# Patient Record
Sex: Female | Born: 1975 | Race: Black or African American | Hispanic: No | Marital: Married | Smoking: Never smoker
Health system: Southern US, Community
[De-identification: ages and names within clinical notes are randomized; demographics above are authoritative.]

## PROBLEM LIST (undated history)

## (undated) DIAGNOSIS — L659 Nonscarring hair loss, unspecified: Secondary | ICD-10-CM

## (undated) DIAGNOSIS — N390 Urinary tract infection, site not specified: Secondary | ICD-10-CM

## (undated) DIAGNOSIS — E119 Type 2 diabetes mellitus without complications: Secondary | ICD-10-CM

## (undated) HISTORY — PX: OTHER SURGICAL HISTORY: SHX169

## (undated) HISTORY — DX: Type 2 diabetes mellitus without complications: E11.9

## (undated) HISTORY — DX: Nonscarring hair loss, unspecified: L65.9

## (undated) HISTORY — DX: Urinary tract infection, site not specified: N39.0

---

## 2016-06-29 DIAGNOSIS — E1065 Type 1 diabetes mellitus with hyperglycemia: Secondary | ICD-10-CM | POA: Diagnosis not present

## 2016-07-15 DIAGNOSIS — L658 Other specified nonscarring hair loss: Secondary | ICD-10-CM | POA: Diagnosis not present

## 2016-07-15 DIAGNOSIS — L669 Cicatricial alopecia, unspecified: Secondary | ICD-10-CM | POA: Diagnosis not present

## 2016-07-29 DIAGNOSIS — E1165 Type 2 diabetes mellitus with hyperglycemia: Secondary | ICD-10-CM | POA: Diagnosis not present

## 2016-08-19 ENCOUNTER — Ambulatory Visit: Payer: Self-pay

## 2016-08-26 ENCOUNTER — Ambulatory Visit: Payer: Self-pay

## 2016-09-09 ENCOUNTER — Other Ambulatory Visit (HOSPITAL_COMMUNITY)
Admission: RE | Admit: 2016-09-09 | Discharge: 2016-09-09 | Disposition: A | Discharge status: Home / Self Care | Payer: 59 | Source: Ambulatory Visit | Attending: Family Medicine | Admitting: Family Medicine

## 2016-09-09 ENCOUNTER — Other Ambulatory Visit: Payer: Self-pay

## 2016-09-09 DIAGNOSIS — Z23 Encounter for immunization: Secondary | ICD-10-CM | POA: Diagnosis not present

## 2016-09-09 DIAGNOSIS — L219 Seborrheic dermatitis, unspecified: Secondary | ICD-10-CM | POA: Diagnosis not present

## 2016-09-09 DIAGNOSIS — Z01419 Encounter for gynecological examination (general) (routine) without abnormal findings: Secondary | ICD-10-CM | POA: Diagnosis not present

## 2016-09-09 DIAGNOSIS — Z0000 Encounter for general adult medical examination without abnormal findings: Secondary | ICD-10-CM | POA: Diagnosis not present

## 2016-09-11 LAB — CYTOLOGY - PAP

## 2016-09-15 DIAGNOSIS — E1065 Type 1 diabetes mellitus with hyperglycemia: Secondary | ICD-10-CM | POA: Diagnosis not present

## 2016-09-21 ENCOUNTER — Other Ambulatory Visit: Payer: Self-pay | Admitting: Family

## 2016-09-21 DIAGNOSIS — Z1231 Encounter for screening mammogram for malignant neoplasm of breast: Secondary | ICD-10-CM

## 2016-09-25 DIAGNOSIS — E1065 Type 1 diabetes mellitus with hyperglycemia: Secondary | ICD-10-CM | POA: Diagnosis not present

## 2016-10-08 ENCOUNTER — Ambulatory Visit: Payer: Self-pay

## 2016-10-29 DIAGNOSIS — E1165 Type 2 diabetes mellitus with hyperglycemia: Secondary | ICD-10-CM | POA: Diagnosis not present

## 2016-11-05 DIAGNOSIS — E1165 Type 2 diabetes mellitus with hyperglycemia: Secondary | ICD-10-CM | POA: Diagnosis not present

## 2016-11-06 DIAGNOSIS — Z0100 Encounter for examination of eyes and vision without abnormal findings: Secondary | ICD-10-CM | POA: Diagnosis not present

## 2016-11-06 DIAGNOSIS — E119 Type 2 diabetes mellitus without complications: Secondary | ICD-10-CM | POA: Diagnosis not present

## 2016-11-10 DIAGNOSIS — E1165 Type 2 diabetes mellitus with hyperglycemia: Secondary | ICD-10-CM | POA: Diagnosis not present

## 2016-12-03 ENCOUNTER — Ambulatory Visit: Payer: 59

## 2016-12-09 ENCOUNTER — Telehealth: Payer: 59 | Admitting: Family

## 2016-12-09 DIAGNOSIS — M545 Low back pain: Secondary | ICD-10-CM

## 2016-12-09 MED ORDER — BACLOFEN 10 MG PO TABS: 10.0000 mg | ORAL_TABLET | Freq: Three times a day (TID) | ORAL | 0 refills | Status: DC

## 2016-12-09 MED ORDER — NAPROXEN 500 MG PO TABS: 500.0000 mg | ORAL_TABLET | Freq: Two times a day (BID) | ORAL | 0 refills | Status: DC

## 2016-12-09 NOTE — Progress Notes (Signed)

## 2016-12-22 DIAGNOSIS — E1065 Type 1 diabetes mellitus with hyperglycemia: Secondary | ICD-10-CM | POA: Diagnosis not present

## 2017-01-01 ENCOUNTER — Ambulatory Visit: Payer: 59

## 2017-01-11 ENCOUNTER — Ambulatory Visit
Admission: RE | Admit: 2017-01-11 | Discharge: 2017-01-11 | Disposition: A | Discharge status: Home / Self Care | Payer: 59 | Source: Ambulatory Visit | Attending: Family | Admitting: Family

## 2017-01-11 DIAGNOSIS — Z1231 Encounter for screening mammogram for malignant neoplasm of breast: Secondary | ICD-10-CM

## 2017-01-11 DIAGNOSIS — N939 Abnormal uterine and vaginal bleeding, unspecified: Secondary | ICD-10-CM | POA: Diagnosis not present

## 2017-01-25 DIAGNOSIS — N930 Postcoital and contact bleeding: Secondary | ICD-10-CM | POA: Diagnosis not present

## 2017-01-25 DIAGNOSIS — N921 Excessive and frequent menstruation with irregular cycle: Secondary | ICD-10-CM | POA: Diagnosis not present

## 2017-01-25 DIAGNOSIS — N939 Abnormal uterine and vaginal bleeding, unspecified: Secondary | ICD-10-CM | POA: Diagnosis not present

## 2017-03-01 ENCOUNTER — Telehealth: Payer: 59 | Admitting: Family

## 2017-03-01 DIAGNOSIS — B373 Candidiasis of vulva and vagina: Secondary | ICD-10-CM

## 2017-03-01 DIAGNOSIS — B3731 Acute candidiasis of vulva and vagina: Secondary | ICD-10-CM

## 2017-03-01 MED ORDER — FLUCONAZOLE 150 MG PO TABS: 150.0000 mg | ORAL_TABLET | ORAL | 1 refills | Status: DC | PRN

## 2017-03-01 NOTE — Progress Notes (Signed)
We are sorry that you are not feeling well. Here is how we plan to help! Based on what you shared with me it looks like you: May have a yeast vaginosis  Vaginosis is an inflammation of the vagina that can result in discharge, itching and pain. The cause is usually a change in the normal balance of vaginal bacteria or an infection. Vaginosis can also result from reduced estrogen levels after menopause.  The most common causes of vaginosis are:   Bacterial vaginosis which results from an overgrowth of one on several organisms that are normally present in your vagina.   Yeast infections which are caused by a naturally occurring fungus called candida.   Vaginal atrophy (atrophic vaginosis) which results from the thinning of the vagina from reduced estrogen levels after menopause.   Trichomoniasis which is caused by a parasite and is commonly transmitted by sexual intercourse.  Factors that increase your risk of developing vaginosis include: Marland Kitchen Medications, such as antibiotics and steroids . Uncontrolled diabetes . Use of hygiene products such as bubble bath, vaginal spray or vaginal deodorant . Douching . Wearing damp or tight-fitting clothing . Using an intrauterine device (IUD) for birth control . Hormonal changes, such as those associated with pregnancy, birth control pills or menopause . Sexual activity . Having a sexually transmitted infection  Your treatment plan is A single Diflucan (fluconazole) 138m tablet once.  I have electronically sent this prescription into the pharmacy that you have chosen.  Be sure to take all of the medication as directed. Stop taking any medication if you develop a rash, tongue swelling or shortness of breath. Mothers who are breast feeding should consider pumping and discarding their breast milk while on these antibiotics. However, there is no consensus that infant exposure at these doses would be harmful.  Remember that medication creams can weaken latex  condoms. .Marland Kitchen  HOME CARE:  Good hygiene may prevent some types of vaginosis from recurring and may relieve some symptoms:  . Avoid baths, hot tubs and whirlpool spas. Rinse soap from your outer genital area after a shower, and dry the area well to prevent irritation. Don't use scented or harsh soaps, such as those with deodorant or antibacterial action. .Marland KitchenAvoid irritants. These include scented tampons and pads. . Wipe from front to back after using the toilet. Doing so avoids spreading fecal bacteria to your vagina.  Other things that may help prevent vaginosis include:  .Marland KitchenDon't douche. Your vagina doesn't require cleansing other than normal bathing. Repetitive douching disrupts the normal organisms that reside in the vagina and can actually increase your risk of vaginal infection. Douching won't clear up a vaginal infection. . Use a latex condom. Both female and female latex condoms may help you avoid infections spread by sexual contact. . Wear cotton underwear. Also wear pantyhose with a cotton crotch. If you feel comfortable without it, skip wearing underwear to bed. Yeast thrives in mCampbell SoupYour symptoms should improve in the next day or two.  GET HELP RIGHT AWAY IF:  . You have pain in your lower abdomen ( pelvic area or over your ovaries) . You develop nausea or vomiting . You develop a fever . Your discharge changes or worsens . You have persistent pain with intercourse . You develop shortness of breath, a rapid pulse, or you faint.  These symptoms could be signs of problems or infections that need to be evaluated by a medical provider now.  MAKE SURE YOU  Understand these instructions.  Will watch your condition.  Will get help right away if you are not doing well or get worse.  Your e-visit answers were reviewed by a board certified advanced clinical practitioner to complete your personal care plan. Depending upon the condition, your plan could have included  both over the counter or prescription medications. Please review your pharmacy choice to make sure that you have choses a pharmacy that is open for you to pick up any needed prescription, Your safety is important to [place]. If you have drug allergies check your prescription carefully.   You can use MyChart to ask questions about today's visit, request a non-urgent call back, or ask for a work or school excuse for 24 hours related to this e-Visit. If it has been greater than 24 hours you will need to follow up with your provider, or enter a new e-Visit to address those concerns. You will get a MyChart message within the next two days asking about your experience. I hope that your e-visit has been valuable and will speed your recovery.

## 2017-03-08 DIAGNOSIS — N979 Female infertility, unspecified: Secondary | ICD-10-CM | POA: Diagnosis not present

## 2017-03-10 DIAGNOSIS — E049 Nontoxic goiter, unspecified: Secondary | ICD-10-CM | POA: Diagnosis not present

## 2017-03-10 DIAGNOSIS — E1165 Type 2 diabetes mellitus with hyperglycemia: Secondary | ICD-10-CM | POA: Diagnosis not present

## 2017-03-20 DIAGNOSIS — E1065 Type 1 diabetes mellitus with hyperglycemia: Secondary | ICD-10-CM | POA: Diagnosis not present

## 2017-03-24 ENCOUNTER — Telehealth: Payer: 59 | Admitting: Family

## 2017-03-24 DIAGNOSIS — A084 Viral intestinal infection, unspecified: Secondary | ICD-10-CM | POA: Diagnosis not present

## 2017-03-24 MED ORDER — ONDANSETRON 4 MG PO TBDP: 4.0000 mg | ORAL_TABLET | Freq: Three times a day (TID) | ORAL | 0 refills | Status: DC | PRN

## 2017-03-24 NOTE — Progress Notes (Signed)
We are sorry that you are not feeling well. Here is how we plan to help!  Based on what you have shared with me it looks like you have a Virus that is irritating your GI tract.  Vomiting is the forceful emptying of a portion of the stomach's content through the mouth.  Although nausea and vomiting can make you feel miserable, it's important to remember that these are not diseases, but rather symptoms of an underlying illness.  When we treat short term symptoms, we always caution that any symptoms that persist should be fully evaluated in a medical office.  I have prescribed a medication that will help alleviate your symptoms and allow you to stay hydrated:  Zofran 4 mg 1 tablet every 8 hours as needed for nausea and vomiting  HOME CARE:  Drink clear liquids.  This is very important! Dehydration (the lack of fluid) can lead to a serious complication.  Start off with 1 tablespoon every 5 minutes for 8 hours.  You may begin eating bland foods after 8 hours without vomiting.  Start with saltine crackers, white bread, rice, mashed potatoes, applesauce.  After 48 hours on a bland diet, you may resume a normal diet.  Try to go to sleep.  Sleep often empties the stomach and relieves the need to vomit.  GET HELP RIGHT AWAY IF:   Your symptoms do not improve or worsen within 2 days after treatment.  You have a fever for over 3 days.  You cannot keep down fluids after trying the medication.  MAKE SURE YOU:   Understand these instructions.  Will watch your condition.  Will get help right away if you are not doing well or get worse.   Thank you for choosing an e-visit. Your e-visit answers were reviewed by a board certified advanced clinical practitioner to complete your personal care plan. Depending upon the condition, your plan could have included both over the counter or prescription medications. Please review your pharmacy choice. Be sure that the pharmacy you have chosen is open so  that you can pick up your prescription now.  If there is a problem you may message your provider in [place] to have the prescription routed to another pharmacy. Your safety is important to [place]. If you have drug allergies check your prescription carefully.  For the next 24 hours, you can use MyChart to ask questions about today's visit, request a non-urgent call back, or ask for a work or school excuse from your e-visit provider. You will get an e-mail in the next two days asking about your experience. I hope that your e-visit has been valuable and will speed your recovery.

## 2017-04-19 DIAGNOSIS — Z3141 Encounter for fertility testing: Secondary | ICD-10-CM | POA: Diagnosis not present

## 2017-04-23 ENCOUNTER — Other Ambulatory Visit: Payer: Self-pay | Admitting: *Deleted

## 2017-04-23 ENCOUNTER — Encounter: Payer: Self-pay | Admitting: *Deleted

## 2017-04-23 VITALS — BP 110/70 | HR 80 | Ht 71.0 in | Wt 196.6 lb

## 2017-04-23 DIAGNOSIS — E1037X9 Type 1 diabetes mellitus with diabetic macular edema, resolved following treatment, unspecified eye: Secondary | ICD-10-CM

## 2017-04-23 LAB — POCT CBG (FASTING - GLUCOSE)-MANUAL ENTRY: Glucose Fasting, POC: 109 mg/dL — AB (ref 70–99)

## 2017-04-23 LAB — POCT GLYCOSYLATED HEMOGLOBIN (HGB A1C): Hemoglobin A1C: 8.1

## 2017-04-23 NOTE — Patient Outreach (Addendum)
[facility]) Care Management   04/23/2017  Penny Andrade November 28, 1976 [id number]  Penny Andrade is an 41 y.o. female who presents to the [facility] office to enroll in the Link To Wellness program for self management assistance with Type I DM.  Subjective: Penny Andrade says she has been with [facility] since June of 2017 and works as a Designer, jewellery for [employer]. She says she was diagnosed with Type I DM in 1997 and was hospitalized at that time. She says she was also hospitalized in October of 2016 for diabetic ketoacidosis while she was [employer] and under much stress. She says she was referred to the Link To Wellness program through new employee orientation and her goal is to achieve improved glucose management so that she meet her A1C target of <7.0% and improve her chances of getting pregnant.  She say her last Hgb A1C was 8.7% in January 2018 despite the use of an Animas insulin pump. She says she wore the [place] in 2014 but had severe skin irritation with blistering to the adhesive so she stopped wearing it. Her current Animas pump was placed in May of 2017 while she was still living in [place].  She says she has an appointment with Dr. Buddy Andrade, (transferring from Dr. Jeanann Andrade),  to establish endocrinology care on 05/28/17.   Objective:   Review of Systems  Constitutional: Negative.     Physical Exam  Constitutional: She is oriented to person, place, and time. She appears well-developed and well-nourished.  Cardiovascular: Normal rate and regular rhythm.   Respiratory: Effort normal.  Musculoskeletal: Normal range of motion.  Neurological: She is alert and oriented to person, place, and time.  Skin: Skin is warm and dry.  Psychiatric: She has a normal mood and affect. Her behavior is normal. Judgment and thought content normal.   BP= 110/60 POC random CBG= 109 POC Hgb A1C= 8.1%  Encounter Medications:    Outpatient Encounter Prescriptions as of 04/23/2017  Medication Sig Note  . insulin lispro (HUMALOG) 100 UNIT/ML injection Inject into the skin 3 (three) times daily before meals. 04/23/2017: Uses in pump   No facility-administered encounter medications on file as of 04/23/2017.     Functional Status:   In your present state of health, do you have any difficulty performing the following activities: 04/23/2017  Hearing? N  Vision? N  Difficulty concentrating or making decisions? N  Walking or climbing stairs? N  Dressing or bathing? N  Doing errands, shopping? N  Preparing Food and eating ? N  Using the Toilet? N  In the past six months, have you accidently leaked urine? N  Do you have problems with loss of bowel control? N  Managing your Medications? N  Managing your Finances? N  Housekeeping or managing your Housekeeping? N  Some recent data might be hidden    Fall/Depression Screening:    PHQ 2/9 Scores 04/23/2017  PHQ - 2 Score 0    Assessment:  [employer] employee with Type I DM enrolling in the Link To Wellness program for self management assistance.   Plan:  [facility] CM Care Plan Problem One     Most Recent Value  Care Plan Problem One  [place] employee with Type DM enrolling in the Link To wellness program not meeting Hgb A1C target as evidenced by today's POC Hgb A1C= 8.1%  Role Documenting the Problem One  Care Management [place] for Problem  One  Active  THN Long Term Goal (31-90 days)  Improved DM management as evidenced by Hgb A1C<7.5% without increased incidences of hypoglycemia  THN Long Term Goal Start Date  04/23/17  Interventions for Problem One Long Term Goal  Discussed Link to Wellness program goals, requirements and benefits, reviewed member's rights and responsibilities ,provided diabetes information packet with explanation of contents, ensured member agreed and signed consent to participate and authorization to release and receive health  information, consent, participation agreement and consent to enroll in program, assessed member's current knowledge of diabetes, reviewed patient's CBG readings, reviewed blood glucose monitoring and interpretation, discussed recommended target ranges for pre-meal and post-meal, provided blood sugar log sheets with targets for pre and post meal, explained how to get her testing supplies at no cost from a [facility] pharmacy, reviewed pump benefit under the program's pharmacy benefit, discussed Medtronic 670 G closed loop insulin delivery system and encouraged her to discuss with her endocrinologist, discussed exercise opportunities offered for free by [facility],  discussed phone applications to use to help with CHO counting when food labels are not available,assessed and discussed results of today's POC Hgb A1C, Hgb A1C goal, and correlation to estimated average glucose, provided information on: prevention, detection, and treatment of long-term complications, discussed recommendations for day to day and long-term diabetes self-care, reviewed recommended daily foot checks, and yearly cholesterol, urine, and eye testing, and recommendations for medical, dental, and emotional self-care, will arrange for Link To Wellness follow up in one month     Will fax today's note to Penny Levels NP and Dr. Buddy Andrade.  RNCM will meet quarterly and as needed with patient per Link To Wellness program guidelines to assist with Type I DM self-management and assess patient's progress toward mutually set goals.

## 2017-04-26 ENCOUNTER — Encounter: Payer: Self-pay | Admitting: *Deleted

## 2017-06-02 DIAGNOSIS — M67911 Unspecified disorder of synovium and tendon, right shoulder: Secondary | ICD-10-CM | POA: Diagnosis not present

## 2017-06-07 ENCOUNTER — Telehealth: Payer: 59 | Admitting: Family

## 2017-06-07 DIAGNOSIS — B001 Herpesviral vesicular dermatitis: Secondary | ICD-10-CM

## 2017-06-07 MED ORDER — VALACYCLOVIR HCL 1 G PO TABS: 2000.0000 mg | ORAL_TABLET | Freq: Two times a day (BID) | ORAL | 0 refills | Status: DC

## 2017-06-07 NOTE — Progress Notes (Signed)
We are sorry that you are not feeling well.  Here is how we plan to help!  Based on what you have shared with me it does look like you have a viral infection.    Most cold sores or fever blisters are small fluid filled blisters around the mouth caused by herpes simplex virus.  The most common strain of the virus causing cold sores is herpes simplex virus 1.  It can be spread by skin contact, sharing eating utensils, or even sharing towels.  Cold sores are contagious to other people until dry. (Approximately 5-7 days).  Wash your hands. You can spread the virus to your eyes through handling your contact lenses after touching the lesions.  Most people experience pain at the sight or tingling sensations in their lips that may begin before the ulcers erupt.  Herpes simplex is treatable but not curable.  It may lie dormant for a long time and then reappear due to stress or prolonged sun exposure.  Many patients have success in treating their cold sores with an over the counter topical called Abreva.  You may apply the cream up to 5 times daily (maximum 10 days) until healing occurs.  If you would like to use an oral antiviral medication to speed the healing of your cold sore, I have sent a prescription to your local pharmacy Valacyclovir 2 gm twice daily for 1 day    HOME CARE:   Wash your hands frequently.  Do not pick at or rub the sore.  Don't open the blisters.  Avoid kissing other people during this time.  Avoid sharing drinking glasses, eating utensils, or razors.  Do not handle contact lenses unless you have thoroughly washed your hands with soap and warm water!  Avoid oral sex during this time.  Herpes from sores on your mouth can spread to your partner's genital area.  Avoid contact with anyone who has eczema or a weakened immune system.  Cold sores are often triggered by exposure to intense sunlight, use a lip balm containing a sunscreen (SPF 30 or higher).  GET HELP RIGHT AWAY  IF:   Blisters look infected.  Blisters occur near or in the eye.  Symptoms last longer than 10 days.  Your symptoms become worse.  MAKE SURE YOU:   Understand these instructions.  Will watch your condition.  Will get help right away if you are not doing well or get worse.    Your e-visit answers were reviewed by a board certified advanced clinical practitioner to complete your personal care plan.  Depending upon the condition, your plan could have  Included both over the counter or prescription medications.    Please review your pharmacy choice.  Be sure that the pharmacy you have chosen is open so that you can pick up your prescription now.  If there is a problem you csn message your provider in [place] to have the prescription routed to another pharmacy.    Your safety is important to [place].  If you have drug allergies check our prescription carefully.  For the next 24 hours you can use MyChart to ask questions about today's visit, request a non-urgent call back, or ask for a work or school excuse from your e-visit provider.  You will get an email in the next two days asking about your experience.  I hope that your e-visit has been valuable and will speed your recovery.

## 2017-06-11 DIAGNOSIS — B069 Rubella without complication: Secondary | ICD-10-CM | POA: Diagnosis not present

## 2017-06-11 DIAGNOSIS — Z3141 Encounter for fertility testing: Secondary | ICD-10-CM | POA: Diagnosis not present

## 2017-06-11 DIAGNOSIS — Z3169 Encounter for other general counseling and advice on procreation: Secondary | ICD-10-CM | POA: Diagnosis not present

## 2017-06-14 DIAGNOSIS — Z119 Encounter for screening for infectious and parasitic diseases, unspecified: Secondary | ICD-10-CM | POA: Diagnosis not present

## 2017-06-17 DIAGNOSIS — Z3141 Encounter for fertility testing: Secondary | ICD-10-CM | POA: Diagnosis not present

## 2017-06-17 DIAGNOSIS — E1065 Type 1 diabetes mellitus with hyperglycemia: Secondary | ICD-10-CM | POA: Diagnosis not present

## 2017-06-21 DIAGNOSIS — Z3141 Encounter for fertility testing: Secondary | ICD-10-CM | POA: Diagnosis not present

## 2017-06-21 DIAGNOSIS — N979 Female infertility, unspecified: Secondary | ICD-10-CM | POA: Diagnosis not present

## 2017-06-22 DIAGNOSIS — E1065 Type 1 diabetes mellitus with hyperglycemia: Secondary | ICD-10-CM | POA: Diagnosis not present

## 2017-06-22 DIAGNOSIS — Z794 Long term (current) use of insulin: Secondary | ICD-10-CM | POA: Diagnosis not present

## 2017-06-22 DIAGNOSIS — E103213 Type 1 diabetes mellitus with mild nonproliferative diabetic retinopathy with macular edema, bilateral: Secondary | ICD-10-CM | POA: Diagnosis not present

## 2017-06-23 DIAGNOSIS — Z3141 Encounter for fertility testing: Secondary | ICD-10-CM | POA: Diagnosis not present

## 2017-06-24 DIAGNOSIS — Z3141 Encounter for fertility testing: Secondary | ICD-10-CM | POA: Diagnosis not present

## 2017-06-26 DIAGNOSIS — Z3189 Encounter for other procreative management: Secondary | ICD-10-CM | POA: Diagnosis not present

## 2017-07-06 DIAGNOSIS — E1065 Type 1 diabetes mellitus with hyperglycemia: Secondary | ICD-10-CM | POA: Diagnosis not present

## 2017-07-14 ENCOUNTER — Encounter: Payer: 59 | Attending: Family | Admitting: *Deleted

## 2017-07-14 DIAGNOSIS — Z713 Dietary counseling and surveillance: Secondary | ICD-10-CM | POA: Diagnosis not present

## 2017-07-14 DIAGNOSIS — E1065 Type 1 diabetes mellitus with hyperglycemia: Secondary | ICD-10-CM | POA: Diagnosis not present

## 2017-07-14 DIAGNOSIS — E1069 Type 1 diabetes mellitus with other specified complication: Secondary | ICD-10-CM

## 2017-07-14 NOTE — Progress Notes (Signed)
Insulin Pump and / or CGM Evaluation Visit:  Date: 07/14/2017   Appt start time: 0900 end time:  1000.  Assessment:  This patient has DM 1 and their primary concerns today: to initiate obtaining Medtronic 670G insulin pump and CGM.  Usual physical activity: goes to gym after work regularly Patient currently is working YES and the schedule is 3 days a week, 12 hour days Patient states knowledge of Carb Counting is 8 on a scale of 1-10 Patient states they are currently testing BG 3-4 times per day Last A1c was 8.1%  She is currently on Animas pump and has used Dexcom G5 in the past but had tape issues especially in water (bathing).   MEDICATIONS: Humalog in current insulin pump    Intervention:    Showed patient the following: Medtronic 670G with CGM,  Demonstrated pump, insulin reservoir and infusion set options, and button pushing for bolus delivery of insulin through the pump.  Discussed current Continuous Glucose Monitoring options with Auto Mode feature  Patient informed of DM 1 Support Group for education and support.  Handouts given during visit include:  Insulin Pump and /or CGM Packet from Medtronic  Monitoring/Evaluation:    Patient does want to continue with pursuit of Medtronic 670G  insulin pump.  Patient asked me to contact local [place] so they can start the process of obtaining the pump. Contact information provided to the patient. I have emailed Roxann Ripple.  Once pump / CGM is shipped, patient to call this office to set up training.

## 2017-07-16 DIAGNOSIS — Z3141 Encounter for fertility testing: Secondary | ICD-10-CM | POA: Diagnosis not present

## 2017-07-20 ENCOUNTER — Other Ambulatory Visit: Payer: Self-pay | Admitting: *Deleted

## 2017-07-20 NOTE — Patient Outreach (Signed)
[facility]) Andrade Management   07/20/2017  SACRED ROA July 03, 1976 [id number]  Penny Andrade is an 41 y.o. female who presents to the [facility] Management office for routine Link To Wellness follow up for self management assistance with Type I DM.  Subjective: Penny Andrade says she saw Dr. Buddy Duty to establish Andrade with him on 6/26 and he checked her thyroid Andrade. She says she also met with Penny Andrade on 07/14/17 as she wishes to transition to the Medtronic 670 G insulin pump as she is currently undergoing IVF in the hopes of getting pregnant. She will marry on 9/29 in [place]. She attributes her weight loss to portion control and consistent exercise and motivation to fit into her wedding dress.  Objective:   Review of Systems  Constitutional: Negative.     Physical Exam  Constitutional: She is oriented to person, place, and time. She appears well-developed and well-nourished.  Cardiovascular: Normal rate and regular rhythm.   Respiratory: Effort normal.  Musculoskeletal: Normal range of motion.  Neurological: She is alert and oriented to person, place, and time.  Skin: Skin is warm and dry.  Psychiatric: She has a normal mood and affect. Her behavior is normal. Judgment and thought content normal.   Today's Vitals   07/20/17 0815  BP: 102/72  Weight: 191 lb 9.6 oz (86.9 kg)  Height: 1.803 m (5' 11" )    Encounter Medications:   Outpatient Encounter Prescriptions as of 04/23/2017  Medication Sig Note  . insulin lispro (HUMALOG) 100 UNIT/ML injection Inject into the skin 3 (three) times daily before meals. 04/23/2017: Uses in pump   No facility-administered encounter medications on file as of 04/23/2017.     Functional Status:   In your present state of health, do you have any difficulty performing the following activities: 04/23/2017  Hearing? N  Vision? N  Difficulty concentrating or making decisions? N  Walking or  climbing stairs? N  Dressing or bathing? N  Doing errands, shopping? N  Preparing Food and eating ? N  Using the Toilet? N  In the past six months, have you accidently leaked urine? N  Do you have problems with loss of bowel control? N  Managing your Medications? N  Managing your Finances? N  Housekeeping or managing your Housekeeping? N  Some recent data might be hidden    Fall/Depression Screening:    PHQ 2/9 Scores 07/14/2017 04/23/2017  PHQ - 2 Score 0 0    Assessment:  [employer] employee with Type I DM, wishing to transition from [facility] to 670 G insulin pump, currently not meeting treatment target Hgb A1C.  Plan:  [facility] CM Andrade Plan Problem One     Most Recent Value  Andrade Plan Problem One [employer] employee with Type 1 DM not meeting Hgb A1C target as evidenced by today's POC Hgb A1C= 8.1%, in process of changing to Medtronic 670 G insulin pump and currently undergoing in vitro fertilization  Role Documenting the Problem One  Andrade Management [place] for Problem One  Active  THN Long Term Goal (31-90 days) Improved DM management as evidenced by Hgb A1C<7.5% without increased incidences of hypoglycemia, transition to Medtronic 670 G insulin pump and successful IVF resulting in pregnancy  THN Long Term Goal Start Date   Interventions for Problem One Long Term Goal Reviewed outcomes of appointments with Dr Buddy Duty on 6/26 and with Penny Andrade RD, CDE, certified pump trainer on 7/18,  reviewed process of changing from [facility] to Medtronic insulin pump and advised Penny Andrade [facility] CM assistant Penny Andrade to notify [employer] and [employer] of copay waiver,discussed current in vitro fertilization treatment,  reviewed upcoming appointments for labs on 7/30 and with Dr. Buddy Duty on 8/28, will arrange for Link To Wellness follow up once Penny Andrade has transitioned to the Medtronic 670 G insulin pump      Will fax today's note to Penny Levels NP and Dr. Buddy Duty.  RNCM will meet quarterly and as needed  with patient per Link To Wellness program guidelines to assist with Type I DM self-management and assess patient's progress toward mutually set goals.  Barrington Ellison RN,CCM,CDE [place] Management Coordinator Link To Wellness and [employer] [phone] Office Fax [phone]

## 2017-07-26 DIAGNOSIS — Z3141 Encounter for fertility testing: Secondary | ICD-10-CM | POA: Diagnosis not present

## 2017-07-26 DIAGNOSIS — R946 Abnormal results of thyroid function studies: Secondary | ICD-10-CM | POA: Diagnosis not present

## 2017-08-02 DIAGNOSIS — Z3141 Encounter for fertility testing: Secondary | ICD-10-CM | POA: Diagnosis not present

## 2017-08-04 DIAGNOSIS — Z3141 Encounter for fertility testing: Secondary | ICD-10-CM | POA: Diagnosis not present

## 2017-08-06 DIAGNOSIS — Z3141 Encounter for fertility testing: Secondary | ICD-10-CM | POA: Diagnosis not present

## 2017-08-10 DIAGNOSIS — N979 Female infertility, unspecified: Secondary | ICD-10-CM | POA: Diagnosis not present

## 2017-08-10 DIAGNOSIS — Z3183 Encounter for assisted reproductive fertility procedure cycle: Secondary | ICD-10-CM | POA: Diagnosis not present

## 2017-08-10 DIAGNOSIS — Z3189 Encounter for other procreative management: Secondary | ICD-10-CM | POA: Diagnosis not present

## 2017-08-16 DIAGNOSIS — Z3183 Encounter for assisted reproductive fertility procedure cycle: Secondary | ICD-10-CM | POA: Diagnosis not present

## 2017-08-24 DIAGNOSIS — E103213 Type 1 diabetes mellitus with mild nonproliferative diabetic retinopathy with macular edema, bilateral: Secondary | ICD-10-CM | POA: Diagnosis not present

## 2017-08-24 DIAGNOSIS — E1065 Type 1 diabetes mellitus with hyperglycemia: Secondary | ICD-10-CM | POA: Diagnosis not present

## 2017-08-24 DIAGNOSIS — Z794 Long term (current) use of insulin: Secondary | ICD-10-CM | POA: Diagnosis not present

## 2017-08-24 DIAGNOSIS — R946 Abnormal results of thyroid function studies: Secondary | ICD-10-CM | POA: Diagnosis not present

## 2017-10-12 DIAGNOSIS — E1065 Type 1 diabetes mellitus with hyperglycemia: Secondary | ICD-10-CM | POA: Diagnosis not present

## 2017-10-15 DIAGNOSIS — E1065 Type 1 diabetes mellitus with hyperglycemia: Secondary | ICD-10-CM | POA: Diagnosis not present

## 2017-10-26 ENCOUNTER — Other Ambulatory Visit: Payer: Self-pay | Admitting: *Deleted

## 2017-10-26 ENCOUNTER — Encounter: Payer: Self-pay | Admitting: *Deleted

## 2017-10-26 NOTE — Patient Outreach (Signed)
Met with Jennine to advise her that diabetes self management assistance will transition from Link to [place] [facility] in 2019 . Ensured that she has completed her health risk assessment at the [email or web address] website and expained that she will receive communication about the [place] Program through [place] [facility] in late December or early January.Barrington Ellison RN,CCM,CDE [place] Management Coordinator Link To Wellness and [employer] [phone] Office Fax [phone]

## 2017-10-28 ENCOUNTER — Encounter: Payer: Self-pay | Admitting: *Deleted

## 2017-11-02 NOTE — Patient Outreach (Addendum)
[facility]) Care Management   10/26/2017  Penny Andrade Jan 22, 1976 [id number]  Penny Andrade is an 41 y.o. female who presents to the [facility] office for routine Link To Wellness follow up for self management assistance with Type I DM.  Subjective: Penny Andrade says her wedding in [place] on 09/25/17 went well and she is very happy, She says she and her husband have decided not to pursue fertility treatments at this time since the first IVF was not successful. She says she last saw Dr. Buddy Duty on 08/24/17 and her Hgb A1C had increased to 9.3%. She attributes this to being busy with her wedding. She says her primary care provider moved out of the area so she will establish care with Dr. Garret Reddish at [place] on  [facility] on 11/11/17.  Objective:   Review of Systems  Constitutional: Negative.     Physical Exam  Constitutional: She is oriented to person, place, and time. She appears well-developed and well-nourished.  Cardiovascular: Normal rate and regular rhythm.  Respiratory: Effort normal.  Musculoskeletal: Normal range of motion.  Neurological: She is alert and oriented to person, place, and time.  Skin: Skin is warm and dry.  Psychiatric: She has a normal mood and affect. Her behavior is normal. Judgment and thought content normal.   Today's Vitals   10/26/17 1201  BP: 120/70  Weight: 191 lb 12.8 oz (87 kg)  Height: 1.803 m (5' 11" )  PainSc: 0-No pain    Encounter Medications:   Outpatient Encounter Prescriptions as of 04/23/2017  Medication Sig Note  . insulin lispro (HUMALOG) 100 UNIT/ML injection Inject into the skin 3 (three) times daily before meals. 04/23/2017: Uses in pump   No facility-administered encounter medications on file as of 04/23/2017.     Functional Status:   In your present state of health, do you have any difficulty performing the following activities: 10/26/2017 04/23/2017   Hearing? N N  Vision? N N  Difficulty concentrating or making decisions? N N  Walking or climbing stairs? N N  Dressing or bathing? N N  Doing errands, shopping? N N  Preparing Food and eating ? N N  Using the Toilet? N N  In the past six months, have you accidently leaked urine? N N  Do you have problems with loss of bowel control? N N  Managing your Medications? N N  Managing your Finances? N N  Housekeeping or managing your Housekeeping? N N  Some recent data might be hidden    Fall/Depression Screening:    PHQ 2/9 Scores 07/14/2017 04/23/2017  PHQ - 2 Score 0 0    Assessment:  [place] employee with Type I DM, on Animas insulin pump,  currently not meeting Hgb A1C treatment target as evidenced by Hgb A1C= 9.3% on 08/24/17.   THN CM Care Plan Problem One     Most Recent Value  Care Plan Problem One [employer] employee with Type 1 DM not meeting Hgb A1C target as evidenced by most recent Hgb A1C= 9.3%, will change to 670 G insulin pump once her Animas pump warranty expires  Role Documenting the Problem One  Care Management [place] for Problem One  Not Active  THN Long Term Goal  In the next 90 days, patient will demonstrate Improved DM self management as evidenced by Hgb A1C<7.5% without increased incidences of hypoglycemia.  THN Long Term Goal Start Date  10/26/17  Interventions for Problem One Long Term Goal Reviewed outcomes of appointment with Dr Buddy Duty on 08/24/17,  discussed her decision not to pursue ongoing fertility treatments at this time, advised her of need to transition from Link To Wellness to [place] [facility] for ongoing diabetes self managment assistance beginning in 2019,  ensured that she has completed her health risk assessment on the myactivehealth website, reviewed upcoming appointments with Dr. Buddy Duty on 12/01/17, will close case to Link To Wellness and notify Dr. Buddy Duty.      Barrington Ellison RN,CCM,CDE [place]  Management Coordinator Link To Wellness and [employer] [phone] Office Fax [phone]

## 2017-11-11 ENCOUNTER — Ambulatory Visit: Payer: 59 | Admitting: Family Medicine

## 2017-11-16 ENCOUNTER — Ambulatory Visit: Payer: 59 | Admitting: Family Medicine

## 2017-11-29 ENCOUNTER — Ambulatory Visit (INDEPENDENT_AMBULATORY_CARE_PROVIDER_SITE_OTHER): Payer: 59 | Admitting: Family Medicine

## 2017-11-29 ENCOUNTER — Encounter: Payer: Self-pay | Admitting: Family Medicine

## 2017-11-29 VITALS — BP 128/82 | HR 77 | Temp 99.0°F | Ht 70.0 in | Wt 195.8 lb

## 2017-11-29 DIAGNOSIS — Z0001 Encounter for general adult medical examination with abnormal findings: Secondary | ICD-10-CM

## 2017-11-29 DIAGNOSIS — E10311 Type 1 diabetes mellitus with unspecified diabetic retinopathy with macular edema: Secondary | ICD-10-CM

## 2017-11-29 DIAGNOSIS — M25511 Pain in right shoulder: Secondary | ICD-10-CM | POA: Diagnosis not present

## 2017-11-29 DIAGNOSIS — Z114 Encounter for screening for human immunodeficiency virus [HIV]: Secondary | ICD-10-CM

## 2017-11-29 DIAGNOSIS — R7989 Other specified abnormal findings of blood chemistry: Secondary | ICD-10-CM

## 2017-11-29 DIAGNOSIS — E1039 Type 1 diabetes mellitus with other diabetic ophthalmic complication: Secondary | ICD-10-CM | POA: Insufficient documentation

## 2017-11-29 DIAGNOSIS — G8929 Other chronic pain: Secondary | ICD-10-CM

## 2017-11-29 NOTE — Progress Notes (Signed)
Phone: [phone]  Subjective:  Patient presents today to establish care.  Prior patient of [facility] at now closed location. Chief complaint-noted.   See problem oriented charting  The following were reviewed and entered/updated in epic: Past Medical History:  Diagnosis Date  . Diabetes mellitus without complication ([place])    diagnosed at 41 years old 10. Dr. Delrae Rend  . UTI (urinary tract infection)    Patient Active Problem List   Diagnosis Date Noted  . DM (diabetes mellitus) type I controlled with eye manifestation [facility])    Past Surgical History:  Procedure Laterality Date  . none      Family History  Problem Relation Age of Onset  . Hypertension Mother   . Arthritis Mother   . Hyperlipidemia Father   . Hypertension Father   . Atrial fibrillation Father        cardioversion and ablation.   . Breast cancer Paternal Grandmother   . Pancreatic cancer Paternal Grandfather   . Multiple myeloma Brother     Medications- reviewed and updated Current Outpatient Medications  Medication Sig Dispense Refill  . Biotin 10000 MCG TABS Take by mouth daily.    . insulin lispro (HUMALOG) 100 UNIT/ML injection Inject into the skin 3 (three) times daily before meals.     No current facility-administered medications for this visit.     Allergies-reviewed and updated Allergies  Allergen Reactions  . Meloxicam Other (See Comments)    Other Reaction: itchy    Social History   Socioeconomic History  . Marital status: Single    Spouse name: None  . Number of children: None  . Years of education: None  . Highest education level: None  Social Needs  . Financial resource strain: None  . Food insecurity - worry: None  . Food insecurity - inability: None  . Transportation needs - medical: None  . Transportation needs - non-medical: None  Occupational History  . None  Tobacco Use  . Smoking status: Never Smoker  . Smokeless tobacco: Never Used  Substance  and Sexual Activity  . Alcohol use: Yes    Comment: drinks on average once weekly  . Drug use: No  . Sexual activity: Yes    Birth control/protection: None  Other Topics Concern  . None  Social History Narrative   Married Sept 2018. Lives with husband. Mom keeps her yorkiepoo. Son is 60 years old in 2018. Patient's husband has disabled son born 47 who is nonverbal from seizure activity- requires full time care.       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    ROS--Full ROS was completed Review of Systems  Constitutional: Negative for chills and fever.  HENT: Negative for hearing loss and tinnitus.   Eyes: Negative for blurred vision and double vision.  Respiratory: Negative for cough and shortness of breath.   Cardiovascular: Negative for chest pain and palpitations.  Gastrointestinal: Negative for heartburn and nausea.  Genitourinary: Negative for dysuria and urgency.  Musculoskeletal: Positive for joint pain (right shoulder pain). Negative for myalgias.  Skin: Negative for itching and rash.  Neurological: Negative for dizziness and headaches.  Endo/Heme/Allergies: Negative for polydipsia. Does not bruise/bleed easily.  Psychiatric/Behavioral: Negative for  hallucinations and substance abuse.   Objective: BP 128/82 (BP Location: Left Arm, Patient Position: Sitting, Cuff Size: Large)   Pulse 77   Temp 99 F (37.2 C) (Oral)   Ht 5' 10"  (1.778 m)   Wt 195 lb 12.8 oz (88.8 kg)   LMP 11/08/2017   SpO2 99%   BMI 28.09 kg/m  Gen: NAD, resting comfortably HEENT: Mucous membranes are moist. Oropharynx normal. TM normal. Eyes: sclera and lids normal, PERRLA Neck: no thyromegaly, no cervical lymphadenopathy CV: RRR no murmurs rubs or  gallops Lungs: CTAB no crackles, wheeze, rhonchi Abdomen: soft/nontender/nondistended/normal bowel sounds. No rebound or guarding.  Ext: no edema Skin: warm, dry Neuro: 5/5 strength in upper and lower extremities, normal gait, normal reflexes  Diabetic Foot Exam - Simple   Simple Foot Form Diabetic Foot exam was performed with the following findings:  Yes 11/29/2017  5:52 PM  Visual Inspection No deformities, no ulcerations, no other skin breakdown bilaterally:  Yes Sensation Testing Intact to touch and monofilament testing bilaterally:  Yes Pulse Check Posterior Tibialis and Dorsalis pulse intact bilaterally:  Yes Comments    Assessment/Plan:  41 y.o. female presenting for annual physical.  Health Maintenance counseling: 1. Anticipatory guidance: Patient counseled regarding regular dental exams -q6 months, eye exams -yearly, wearing seatbelts.  2. Risk factor reduction:  Advised patient of need for regular exercise and diet rich and fruits and vegetables to reduce risk of heart attack and stroke. Exercise- for most part 3x a week- has had slight drop off recently since marriage- knows to restart this. Diet-wants to get weight down to about 180- got as low as 189 with wedding.  Wt Readings from Last 3 Encounters:  11/29/17 195 lb 12.8 oz (88.8 kg)  10/26/17 191 lb 12.8 oz (87 kg)  07/20/17 191 lb 9.6 oz (86.9 kg)  3. Immunizations/screenings/ancillary studies Immunization History  Administered Date(s) Administered  . Influenza-Unspecified 09/27/2017  4. Cervical cancer screening- 08/1316 with 3 year repeat. No HPV was done. Appears she will do with PCP office. Saw [facility] for fertility only 5. Breast cancer screening-  breast exam declines- does self exams- and mammogram 01/11/17 6. Colon cancer screening - no family history, start at age 53-50 8. Birth control/STD check- at [facility]- has tried 2 rounds of IVF. States    Status of chronic or acute concerns    Apparently had low TSH within recent memory- requests TSH update. No unintentional weight loss, diarrhea.   Chronic right shoulder pain - Plan: Ambulatory referral to Sports Medicine S: has had 6 months of rightShoulder pain. She wondered if it was from carrying heavy purse. She saw [facility] august- cortisone injection. Some improvement but never full relief. She has some left shoulder pain at times too. She did have some numbness/heaviness in right arm which improved with injection- now getting slightly worse again. States had shoulder film but not cervical. Did not go to PT and did not do the home exercises frequently that she was given though did some.  A/P: Patient would prefer a referral in office with Dr. Paulla Fore over returning to Dr. Paulla Fore. She is interested in getting to bottom of pain more and potential ultrasound/MRI given lack of improvement with injection. I referred her to Dr. Paulla Fore as requested. I did not examine shoulder/neck as planned.   DM (diabetes mellitus) type I controlled with eye manifestation ([place]) diagnosed at 41 years old 76. Dr. Delrae Rend. Humalog insulin pump. Pneumovax sept 2017.  Get  records for last urine microalbumin and a1c. Foot exam normal today Retinopathy with macular edema right eye- will get records here too- luckily has been stable- following with [facility]  Future Appointments  Date Time Provider [place]  12/01/2017  8:30 AM [facility]  12/03/2017  3:00 PM Gerda Diss, DO [facility]   Once a year physical  Orders Placed This Encounter  Procedures  . CBC    Standing Status:   Future    Standing Expiration Date:   11/29/2018  . Comprehensive metabolic panel    [place]    Standing Status:   Future    Standing Expiration Date:   11/29/2018  . Lipid panel    Standing Status:   Future    Standing Expiration Date:   11/29/2018  . TSH    Standing Status:   Future    Standing Expiration Date:   11/29/2018  .  HIV antibody    Standing Status:   Future    Standing Expiration Date:   11/29/2018  . Ambulatory referral to Sports Medicine    Referral Priority:   Routine    Referral Type:   Consultation    Referred to Provider:   Gerda Diss, DO    Number of Visits Requested:   1   Return precautions advised.  Garret Reddish, MD

## 2017-11-29 NOTE — Assessment & Plan Note (Signed)
diagnosed at 41 years old 52. Dr. Delrae Rend. Humalog insulin pump. Pneumovax sept 2017.  Get records for last urine microalbumin and a1c. Foot exam normal today Retinopathy with macular edema right eye- will get records here too- luckily has been stable- following with [facility]

## 2017-11-29 NOTE — Patient Instructions (Addendum)
Sign release of information at the check out desk for [facility]. Need the following 1. All immuizations- tetanus and pneumovax (may have to get through HR if not in [place] records) 2. Last pap smear 3. It would be great if they could send your a1c and urine microalbumin as well since that should be in same system.   Sign release of information at the check out desk for [facility] for  1. Diabetic eye exam  Please schedule a visit with Dr. Paulla Fore of sports medicine before you leave  Foot exam  Return for labs at [place] as listed

## 2017-12-01 ENCOUNTER — Other Ambulatory Visit (INDEPENDENT_AMBULATORY_CARE_PROVIDER_SITE_OTHER): Payer: 59

## 2017-12-01 DIAGNOSIS — Z0001 Encounter for general adult medical examination with abnormal findings: Secondary | ICD-10-CM

## 2017-12-01 DIAGNOSIS — Z114 Encounter for screening for human immunodeficiency virus [HIV]: Secondary | ICD-10-CM | POA: Diagnosis not present

## 2017-12-01 DIAGNOSIS — R7989 Other specified abnormal findings of blood chemistry: Secondary | ICD-10-CM

## 2017-12-01 DIAGNOSIS — E10311 Type 1 diabetes mellitus with unspecified diabetic retinopathy with macular edema: Secondary | ICD-10-CM | POA: Diagnosis not present

## 2017-12-01 DIAGNOSIS — E1065 Type 1 diabetes mellitus with hyperglycemia: Secondary | ICD-10-CM | POA: Diagnosis not present

## 2017-12-01 DIAGNOSIS — E103213 Type 1 diabetes mellitus with mild nonproliferative diabetic retinopathy with macular edema, bilateral: Secondary | ICD-10-CM | POA: Diagnosis not present

## 2017-12-01 DIAGNOSIS — Z794 Long term (current) use of insulin: Secondary | ICD-10-CM | POA: Diagnosis not present

## 2017-12-01 LAB — COMPREHENSIVE METABOLIC PANEL
ALBUMIN: 4.3 g/dL (ref 3.5–5.2)
ALK PHOS: 41 U/L (ref 39–117)
ALT: 8 U/L (ref 0–35)
AST: 13 U/L (ref 0–37)
BUN: 9 mg/dL (ref 6–23)
CALCIUM: 8.9 mg/dL (ref 8.4–10.5)
CO2: 25 mEq/L (ref 19–32)
Chloride: 106 mEq/L (ref 96–112)
Creatinine, Ser: 0.82 mg/dL (ref 0.40–1.20)
GFR: 97.55 mL/min (ref >60.00)
Glucose, Bld: 110 mg/dL — ABNORMAL HIGH (ref 70–99)
POTASSIUM: 4.3 meq/L (ref 3.5–5.1)
SODIUM: 140 meq/L (ref 135–145)
TOTAL PROTEIN: 7 g/dL (ref 6.0–8.3)
Total Bilirubin: 0.7 mg/dL (ref 0.2–1.2)

## 2017-12-01 LAB — CBC
HEMATOCRIT: 34.1 % — AB (ref 36.0–46.0)
HEMOGLOBIN: 10.9 g/dL — AB (ref 12.0–15.0)
MCHC: 32 g/dL (ref 30.0–36.0)
MCV: 75.5 fl — AB (ref 78.0–100.0)
PLATELETS: 251 10*3/uL (ref 150.0–400.0)
RBC: 4.51 Mil/uL (ref 3.87–5.11)
RDW: 15.6 % — ABNORMAL HIGH (ref 11.5–15.5)
WBC: 4 10*3/uL (ref 4.0–10.5)

## 2017-12-01 LAB — LIPID PANEL
CHOLESTEROL: 163 mg/dL (ref 0–200)
HDL: 81.1 mg/dL (ref >39.00)
LDL CALC: 74 mg/dL (ref 0–99)
NonHDL: 82.28
TRIGLYCERIDES: 40 mg/dL (ref 0.0–149.0)
Total CHOL/HDL Ratio: 2
VLDL: 8 mg/dL (ref 0.0–40.0)

## 2017-12-01 LAB — TSH: TSH: 0.58 u[IU]/mL (ref 0.35–4.50)

## 2017-12-02 ENCOUNTER — Encounter: Payer: Self-pay | Admitting: Family Medicine

## 2017-12-02 LAB — HIV ANTIBODY (ROUTINE TESTING W REFLEX): HIV 1&2 Ab, 4th Generation: NONREACTIVE

## 2017-12-03 ENCOUNTER — Ambulatory Visit: Payer: Self-pay

## 2017-12-03 ENCOUNTER — Ambulatory Visit (INDEPENDENT_AMBULATORY_CARE_PROVIDER_SITE_OTHER): Payer: 59 | Admitting: Sports Medicine

## 2017-12-03 ENCOUNTER — Encounter: Payer: Self-pay | Admitting: Sports Medicine

## 2017-12-03 VITALS — BP 128/80 | HR 85 | Ht 70.0 in | Wt 197.2 lb

## 2017-12-03 DIAGNOSIS — M7501 Adhesive capsulitis of right shoulder: Secondary | ICD-10-CM | POA: Diagnosis not present

## 2017-12-03 DIAGNOSIS — M7500 Adhesive capsulitis of unspecified shoulder: Secondary | ICD-10-CM | POA: Insufficient documentation

## 2017-12-03 DIAGNOSIS — E10311 Type 1 diabetes mellitus with unspecified diabetic retinopathy with macular edema: Secondary | ICD-10-CM

## 2017-12-03 DIAGNOSIS — M25511 Pain in right shoulder: Secondary | ICD-10-CM | POA: Diagnosis not present

## 2017-12-03 DIAGNOSIS — M25512 Pain in left shoulder: Secondary | ICD-10-CM | POA: Diagnosis not present

## 2017-12-03 MED ORDER — AMITRIPTYLINE HCL 25 MG PO TABS: 25.0000 mg | ORAL_TABLET | Freq: Every day | ORAL | 3 refills | Status: DC

## 2017-12-03 NOTE — Addendum Note (Signed)
Addended by: Teresa Coombs D on: 12/03/2017 04:12 PM   Modules accepted: Orders

## 2017-12-03 NOTE — Assessment & Plan Note (Signed)
Findings are suggestive of early adhesive capsulitis.  Serial large-volume injections are recommended at this time as well as addition of amitriptyline for neuromodulation.  Referral to physical therapy.  Follow-up in 6 weeks for repeat injection.

## 2017-12-03 NOTE — Assessment & Plan Note (Signed)
Poorly controlled, likely contributing Needs to have improved diabetes control but did caution that steroids will slightly increase.  Low-dose steroid used.

## 2017-12-03 NOTE — Procedures (Signed)
PROCEDURE NOTE -  ULTRASOUND GUIDEDInjection: Large-volume intra-articular injection of right shoulder Images were obtained and interpreted by myself, Teresa Coombs, DO  Images have been saved and stored to PACS system. Images obtained on: GE S7 Ultrasound machine  ULTRASOUND FINDINGS:   FINDINGS:  Biceps Tendon: Normal Pec Major Insertion: Normal Subscapularis Tendon: Thickening, there is tightness and slight questionable internal impingement Supraspinatus Tendon: Thickening.  No significant tear.  Small layer of bursal fluid but this is minimal. Infraspinatus/Teres Minor Tendon: Normal AC Joint: n/a JOINT: No appreciable labral tear however there does appear to be tightness and thickening of the capsule.  LABRUM: No appreciable tear   IMPRESSION:  1. Findings are suggestive of adhesive capsulitis with Tendinopathic changes of the subscap and supraspinatus   DESCRIPTION OF PROCEDURE:  The patient's clinical condition is marked by substantial pain and/or significant functional disability. Other conservative therapy has not provided relief, is contraindicated, or not appropriate. There is a reasonable likelihood that injection will significantly improve the patient's pain and/or functional impairment.  After discussing the risks, benefits and expected outcomes of the injection and all questions were reviewed and answered, the patient wished to undergo the above named procedure. Verbal consent was obtained.  The ultrasound was used to identify the target structure and adjacent neurovascular structures. The skin was then prepped in sterile fashion and the target structure was injected under direct visualization using sterile technique as below:  Right PREP: Alcohol, Ethel Chloride APPROACH: Direct, posterior, stopcock technique, 22g 3.5in. INJECTATE: 5cc 1% lidocaine, 2cc 0.5% marcaine, 1cc 84m/mL DepoMedrol , 7 cc of normal sterile saline push ASPIRATE: N/A DRESSING: Band-Aid     Post procedural instructions including recommending icing and warning signs for infection were reviewed.  This procedure was well tolerated and there were no complications.   IMPRESSION: Succesful [place]ided Injection -1 of 2 large-volume intra-articular injections for adhesive capsulitis

## 2017-12-03 NOTE — Progress Notes (Signed)
Penny Andrade. Penny Andrade at [place] - 41 y.o. female MRN [id number]  Date of birth: 1976/01/31   Scribe for today's visit: Josepha Pigg, CMA    SUBJECTIVE:  Penny Andrade is here for New Patient (Initial Visit) (bilateral shoulder pain, R>L) .  Referred by: Garret Reddish, MD Her RT and LT shoulders symptoms INITIALLY: Began about 6+ months ago and she feels it could be related to carrying a heavy purse.  Described as mild numbness and heaviness, aching with certain movements. She denies clicking or popping. The pain hurts around the entire shoulder.  Worsened with sleeping on her sides, reaching across body, reaching back Improved after steroid injection at [facility] in August 2018.  Additional associated symptoms include: At times she will get a numb and heavy feeling in the RT arm. She denies neck pain.    At this time symptoms show no change compared to onset  She has been taking Ibuprofen with relief.  She has tried using heat with some relief.   Xray RT shoulder was done at [facility] in August 2018.  No recent x ray c-spine   ROS Reports night time disturbances. Denies fevers, chills, or night sweats. Denies unexplained weight loss. Denies personal history of cancer. Denies changes in bowel or bladder habits. Denies recent unreported falls. Denies new or worsening dyspnea or wheezing. Denies headaches or dizziness.  Reports numbness, tingling or weakness  In the extremities.  Denies dizziness or presyncopal episodes Denies lower extremity edema     HISTORY & PERTINENT PRIOR DATA:  Prior History reviewed and updated per electronic medical record.  Significant history, findings, studies and interim changes include:  reports that  has never smoked. she has never used smokeless tobacco. Recent Labs    04/23/17 1116  HGBA1C 8.1   No specialty comments available. Problem    Adhesive Capsulitis  DM (Diabetes Mellitus) Type I Controlled With Eye Manifestation ([facility])   diagnosed at 41 years old 58. Dr. Delrae Rend. Humalog insulin pump. Pneumovax sept 2017.       OBJECTIVE:  VS:  HT:5' 10"  (177.8 cm)   WT:197 lb 3.2 oz (89.4 kg)  BMI:28.3    BP:128/80  HR:85bpm  TEMP: ( )  RESP:97 %  PHYSICAL EXAM: Constitutional: WDWN, Non-toxic appearing. Psychiatric: Alert & appropriately interactive. Not depressed or anxious appearing. Respiratory: No increased work of breathing. Trachea Midline Eyes: Pupils are equal. EOM intact without nystagmus. No scleral icterus  UPPER EXTREMITIES No clubbing or cyanosis appreciated Capillary Refill is normal, less than 2 seconds No signficant upper extremity generalized edema Radial Pulses: Normal and symmetrically palpable Sensation in UE dermatomes: intact to light touch   Right Shoulder Exam: Normal alignment & Contours Skin: No overlying erythema/ecchymosis Neck: normal range of motion and supple Axial loading and circumduction produces: Moderate pain with no crepitation, limited range of motion  Non tender over: Bony Landmarks TTP over: Generalized anterior shoulder,  Drop arm test: negative Hawkins: Somewhat limited range but no pain  Neers: Limited range, no pain   Internal Rotation: full range without pain. Strength: normal External Rotation: Painful with resistance and markedly limited by 30 degrees of arc compared to the contralateral side at 30 degrees of abduction. Strength: 5+  Empty can: normal, no pain Strength: normal Speeds: normal, no pain Strength: normal O'Briens: positive, mild pain Strength: normal    ASSESSMENT & PLAN:   1. Bilateral shoulder  pain, unspecified chronicity   2. Controlled type 1 diabetes mellitus with retinopathy and macular edema, unspecified laterality, unspecified retinopathy severity ([place])   3. Adhesive capsulitis of right shoulder    PLAN:  Findings:   Large-volume intra-articular injection today Referral to PT Amitriptyline Discussed expected outcome of prolonged duration with adhesive capsulitis.  Ultrasound is otherwise reassuring for likely no significant surgical injury to the rotator cuff but some Tendinopathic changes.    DM (diabetes mellitus) type I controlled with eye manifestation ([place]) Poorly controlled, likely contributing Needs to have improved diabetes control but did caution that steroids will slightly increase.  Low-dose steroid used.  Adhesive capsulitis Findings are suggestive of early adhesive capsulitis.  Serial large-volume injections are recommended at this time as well as addition of amitriptyline for neuromodulation.  Referral to physical therapy.  Follow-up in 6 weeks for repeat injection.   ++++++++++++++++++++++++++++++++++++++++++++ Orders & Meds: Orders Placed This Encounter  Procedures  . [place] LIMITED JOINT SPACE STRUCTURES UP BILAT(NO LINKED CHARGES)  . Ambulatory referral to Physical Therapy    Meds ordered this encounter  Medications  . amitriptyline (ELAVIL) 25 MG tablet    Sig: Take 1 tablet (25 mg total) by mouth at bedtime.    Dispense:  30 tablet    Refill:  3    ++++++++++++++++++++++++++++++++++++++++++++ Follow-up: Return in about 6 weeks (around 01/14/2018).   Pertinent documentation may be included in additional procedure notes, imaging studies, problem based documentation and patient instructions. Please see these sections of the encounter for additional information regarding this visit. CMA/ATC served as Education administrator during this visit. History, Physical, and Plan performed by medical provider. Documentation and orders reviewed and attested to.      Gerda Diss, [place] [facility] Physician

## 2017-12-03 NOTE — Patient Instructions (Signed)

## 2017-12-31 ENCOUNTER — Encounter: Payer: Self-pay | Admitting: Physical Therapy

## 2017-12-31 ENCOUNTER — Ambulatory Visit: Payer: 59 | Attending: General Practice | Admitting: Physical Therapy

## 2017-12-31 DIAGNOSIS — M25611 Stiffness of right shoulder, not elsewhere classified: Secondary | ICD-10-CM | POA: Insufficient documentation

## 2017-12-31 DIAGNOSIS — M6281 Muscle weakness (generalized): Secondary | ICD-10-CM | POA: Insufficient documentation

## 2017-12-31 DIAGNOSIS — M25511 Pain in right shoulder: Secondary | ICD-10-CM | POA: Diagnosis not present

## 2017-12-31 DIAGNOSIS — M25512 Pain in left shoulder: Secondary | ICD-10-CM | POA: Diagnosis not present

## 2017-12-31 DIAGNOSIS — G8929 Other chronic pain: Secondary | ICD-10-CM | POA: Diagnosis not present

## 2017-12-31 DIAGNOSIS — M25612 Stiffness of left shoulder, not elsewhere classified: Secondary | ICD-10-CM | POA: Diagnosis not present

## 2017-12-31 NOTE — Patient Instructions (Signed)
Penny Andrade PT [facility] [address], [address] [place], [place] [address] Phone # [phone] Fax [phone]    

## 2017-12-31 NOTE — Therapy (Signed)
[facility] [address], [place], [place], [address] Phone: [phone]   Fax:  [phone]  Physical Therapy Evaluation  Patient Details  Name: Penny Andrade MRN: [id number] Date of Birth: 02/25/1976 Referring Provider: Dr. Teresa Coombs   Encounter Date: 12/31/2017  PT End of Session - 12/31/17 1244    Visit Number  1    Date for PT Re-Evaluation  02/25/18    Authorization Type  [employer]    PT Start Time  0930    PT Stop Time  1015    PT Time Calculation (min)  45 min    Activity Tolerance  Patient tolerated treatment well       Past Medical History:  Diagnosis Date  . Diabetes mellitus without complication ([place])    diagnosed at 42 years old 67. Dr. Delrae Rend  . UTI (urinary tract infection)     Past Surgical History:  Procedure Laterality Date  . none      There were no vitals filed for this visit.   Subjective Assessment - 12/31/17 0935    Subjective  No injury;  6-12 month history of right shoulder pain with heaviness in the whole arm, then noticed decreased ROM especially internal rotation behind back, then unable to sleep on her side b/c of pain.  Now left shoulder has begun to bother in last 3 months with certain movements and sidelying.  Right shoulder injection in the summer and 2nd  right shoulder injection 12/03/17 which helped.      Limitations  House hold activities;Lifting    Diagnostic tests  x-ray negative ;  U/S "looked like frozen shoulder and a little bursitis"    Patient Stated Goals  be able to increase ROM;  improve pain;  sleep at night    Currently in Pain?  Yes    Pain Score  7  no pain at rest    Pain Location  Shoulder    Pain Orientation  Left;Right    Pain Type  Chronic pain    Pain Onset  More than a month ago    Pain Frequency  Intermittent    Aggravating Factors   lying on it; internal rotation , sudden movement ;  lift something too heavy    Pain Relieving Factors   heat; rubbing it         [facility] PT Assessment - 12/31/17 0001      Assessment   Medical Diagnosis  bil shoulder pain    Referring Provider  Dr. Teresa Coombs    Onset Date/Surgical Date  -- 6 months    Hand Dominance  Right    Next MD Visit  Jan 16    Prior Therapy  1x visit      Precautions   Precautions  None      Restrictions   Weight Bearing Restrictions  No      Balance Screen   Has the patient fallen in the past 6 months  No    Has the patient had a decrease in activity level because of a fear of falling?   No    Is the patient reluctant to leave their home because of a fear of falling?   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[employer]  go to movies, read, watch sports, gym       Observation/Other Assessments   Focus on Therapeutic Outcomes (FOTO)   36% limitation      AROM   Right Shoulder Flexion  136 Degrees    Right Shoulder ABduction  120 Degrees    Right Shoulder Internal Rotation  -- right buttock    Right Shoulder External Rotation  75 Degrees    Left Shoulder Flexion  147 Degrees pain    Left Shoulder ABduction  163 Degrees    Left Shoulder Internal Rotation  -- left lateral hip    Left Shoulder External Rotation  80 Degrees      Strength   Strength Assessment Site  -- scapular strength grossly 4/5    Right Shoulder Flexion  4/5    Right Shoulder Extension  4/5    Right Shoulder ABduction  4-/5    Right Shoulder Internal Rotation  4/5    Right Shoulder External Rotation  4/5    Left Shoulder Flexion  4/5    Left Shoulder Extension  4/5    Left Shoulder ABduction  4-/5    Left Shoulder Internal Rotation  4/5    Left Shoulder External Rotation  4/5      Flexibility   Soft Tissue Assessment /Muscle Length  -- endrange resistance with shoulder passive ROM      Palpation   Palpation  comment  No marked tender points today      Hawkins-Kennedy test   Findings  Positive    Comments  bil      Empty Can test   Findings  Negative      Lag time at 0 degrees   Findings  Negative      Drop Arm test   Findings  Negative             Objective measurements completed on examination: See above findings.              PT Education - 12/31/17 1244    Education provided  Yes    Education Details  shoulder flexion stretching every 2 hours    Person(s) Educated  Patient    Methods  Explanation;Handout;Demonstration    Comprehension  Verbalized understanding;Returned demonstration       PT Short Term Goals - 12/31/17 1547      PT SHORT TERM GOAL #1   Title  The patient will demonstrate basic self care strategies for ROM and pain control    Time  4    Period  Weeks    Status  New    Target Date  01/28/18      PT SHORT TERM GOAL #2   Title  The patient will demonstrate improved right shoulder flexion ROM to 145 and left to 155 degrees needed for overhead reaching    Time  4    Period  Weeks    Status  New      PT SHORT TERM GOAL #3   Title  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      Time  4    Period  Weeks    Status  New        PT Long Term Goals - 12/31/17 1550      PT LONG TERM GOAL #1   Title  The patient will be independent in safe self  progression of HEP and appropriate gym program    Time  8    Period  Weeks    Status  New    Target Date  02/25/18      PT LONG TERM GOAL #2   Title  The patient will have improved right shoulder flexion to 150 and 160 on left for reaching overhead    Time  8    Period  Weeks    Status  New      PT LONG TERM GOAL #3   Title  Improved bilateral internal rotation to 45 degree or to mid lumbar region of the back needed for grooming/dressing    Time  8    Period  Weeks    Status  New      PT LONG TERM GOAL #4   Title  The patient will  have improved shoulder abduction strength to 4/5 and other glenohumeral/scapular muscles to at least 4+/5 for lifting and carrying objects    Time  8    Period  Weeks    Status  New      PT LONG TERM GOAL #5   Title  50% improvement in overall pain for sleeping, 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  She has had 2 injections in her shoulder (one very recently) which helped some.  She has also had a newer onset of left shoulder/arm discomfort and stiffness in the last few months.  Endrange pain and limited ROM in shoulder bilaterally:  right flex 136, abd 120, internal rotation to buttock, external rotation to 75 degrees;  left flex 147, abduction 163, internal rotation to buttock and external rotation to 80 degrees.   Glenohumeral and scapular muscle strength grossly 4/5 except abduction 4-/5.  +Hawkins Merrilyn Puma but other cuff impingement tests  negative.  She has 3 risk factors for adhesive capsulitis including diabetes, gender and age.  She would benefit from PT to address these deficits.      History and Personal Factors relevant to plan of care:  Type 1 diabetes, 3 risk factors for adhesive capsulitis; bilateral symptoms with multi body regions affected    Clinical Presentation  Evolving    Clinical Presentation due to:  pain and ROM worsening over time and now bilateral rather than single arm affected    Clinical Decision Making  Moderate    Rehab Potential  Good     Clinical Impairments Affecting Rehab Potential  none    PT Frequency  2x / week    PT Duration  8 weeks    PT Treatment/Interventions  ADLs/Self Care Home Management;Cryotherapy;Electrical Stimulation;Ultrasound;Moist Heat;Iontophoresis 68m/ml Dexamethasone;Therapeutic activities;Therapeutic exercise;Patient/family education;Neuromuscular re-education;Manual techniques;Taping;Dry needling    PT Next Visit Plan  UE  Ranger, pulleys for ROM;  add stretches to HEP;  moist heat/ultrasound to warm soft tissue for stretching bil shoulder; manual therapy for glenohumeral joint mobs and soft tissue; kinesiotape;  hold on ionto since recent injection    Consulted and Agree with Plan of Care  Patient       Patient will benefit from skilled therapeutic intervention in order to improve the following deficits and impairments:  Pain, Decreased range of motion, Decreased strength, Hypomobility, Impaired UE functional use  Visit Diagnosis: Stiffness of right shoulder, not elsewhere classified - Plan: PT plan of care cert/re-cert  Stiffness of left shoulder, not elsewhere classified - Plan: PT plan of care cert/re-cert  Chronic right shoulder pain - Plan: PT plan of care cert/re-cert  Acute pain of left shoulder - Plan: PT plan of care cert/re-cert  Muscle weakness (generalized) - Plan: PT plan of care cert/re-cert     Problem List Patient Active Problem List   Diagnosis Date Noted  . Adhesive capsulitis 12/03/2017  . DM (diabetes mellitus) type I controlled with eye manifestation [facility])    [name], PT 12/31/17 3:59 PM Phone: [phone] Fax: [phone]  [name] 12/31/2017, 3:57 PM  [facility] [address] [place], [place], [address] Phone: [phone]   Fax:  [phone]  Name: Penny Andrade MRN: [id number] Date of Birth: 05/25/1976

## 2018-01-05 ENCOUNTER — Ambulatory Visit (INDEPENDENT_AMBULATORY_CARE_PROVIDER_SITE_OTHER): Payer: Self-pay | Admitting: Emergency Medicine

## 2018-01-05 DIAGNOSIS — Z0000 Encounter for general adult medical examination without abnormal findings: Secondary | ICD-10-CM

## 2018-01-06 ENCOUNTER — Encounter: Payer: Self-pay | Admitting: Physical Therapy

## 2018-01-06 ENCOUNTER — Ambulatory Visit: Payer: 59 | Admitting: Physical Therapy

## 2018-01-06 DIAGNOSIS — M25612 Stiffness of left shoulder, not elsewhere classified: Secondary | ICD-10-CM

## 2018-01-06 DIAGNOSIS — G8929 Other chronic pain: Secondary | ICD-10-CM

## 2018-01-06 DIAGNOSIS — M25511 Pain in right shoulder: Secondary | ICD-10-CM

## 2018-01-06 DIAGNOSIS — M25611 Stiffness of right shoulder, not elsewhere classified: Secondary | ICD-10-CM

## 2018-01-06 DIAGNOSIS — M6281 Muscle weakness (generalized): Secondary | ICD-10-CM | POA: Diagnosis not present

## 2018-01-06 DIAGNOSIS — M25512 Pain in left shoulder: Secondary | ICD-10-CM | POA: Diagnosis not present

## 2018-01-06 NOTE — Therapy (Signed)
[facility] [address], [place], [place], [address] Phone: [phone]   Fax:  [phone]  Physical Therapy Treatment  Patient Details  Name: Penny Andrade MRN: [id number] Date of Birth: 01-19-1976 Referring Provider: Dr. Teresa Coombs   Encounter Date: 01/06/2018  PT End of Session - 01/06/18 1022    Visit Number  2    Date for PT Re-Evaluation  02/25/18    Authorization Type  [employer]    PT Start Time  0933    PT Stop Time  1020    PT Time Calculation (min)  47 min    Activity Tolerance  Patient tolerated treatment well       Past Medical History:  Diagnosis Date  . Diabetes mellitus without complication ([place])    diagnosed at 42 years old 84. Dr. Delrae Rend  . UTI (urinary tract infection)     Past Surgical History:  Procedure Laterality Date  . none      There were no vitals filed for this visit.  Subjective Assessment - 01/06/18 0930    Subjective  No pain this morning.  No changes from initial eval.    (Pended)     Patient Stated Goals  be able to increase ROM;  improve pain;  sleep at night  (Pended)     Currently in Pain?  No/denies  (Pended)     Pain Score  0-No pain  (Pended)     Pain Location  Shoulder  (Pended)     Pain Orientation  Right;Left  (Pended)                       [facility] Adult PT Treatment/Exercise - 01/06/18 0001      Shoulder Exercises: Supine   Flexion  AAROM;Right;Left;10 reps UE Ranger      Shoulder Exercises: Standing   Extension  Strengthening;Right;Left;10 reps;Theraband    Theraband Level (Shoulder Extension)  Level 3 (Green)    Other Standing Exercises  UE Ranger on wall L5 10x right/left    Other Standing Exercises  D1/D2 diagonal extension with green band 10x each bil      Shoulder Exercises: ROM/Strengthening   Other ROM/Strengthening Exercises  Pulleys 3 min    Other ROM/Strengthening Exercises  Discussion of low load long duration  stretch      Moist Heat Therapy   Number Minutes Moist Heat  15 Minutes concurrent with manual therapy    Moist Heat Location  Shoulder      Manual Therapy   Manual Therapy  Joint mobilization;Soft tissue mobilization    Manual therapy comments  passive flexion bil 10x with mild overpressure    Joint Mobilization  glenohumeral joint mobs grade 3 3x 20 sec bil    Soft tissue mobilization  instrument assisted soft tissue to deltoids bil         All performed bilaterally.        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      Time  4    Period  Weeks    Status  New        PT Long Term Goals - 12/31/17 1550      PT LONG TERM GOAL #1   Title  The patient will be independent in safe self progression of HEP and appropriate gym program    Time  8    Period  Weeks    Status  New    Target Date  02/25/18      PT LONG TERM GOAL #2   Title  The patient will have improved right shoulder flexion to 150 and 160 on left for reaching overhead    Time  8    Period  Weeks    Status  New      PT LONG TERM GOAL #3   Title  Improved bilateral internal rotation to 45 degree or to mid lumbar region of the back needed for grooming/dressing    Time  8    Period  Weeks    Status  New      PT LONG TERM GOAL #4   Title  The patient will have improved shoulder abduction strength to 4/5 and other glenohumeral/scapular muscles to at least 4+/5 for lifting and carrying objects     Time  8    Period  Weeks    Status  New      PT LONG TERM GOAL #5   Title  50% improvement in overall pain for sleeping, 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  Verbal and tactile cues to decrease compensatory shoulder hike with elevation.  She declines the need for modalities at the end of session for pain control.     Rehab Potential  Good    Clinical Impairments Affecting Rehab Potential  none    PT Frequency  2x / week    PT Duration  8 weeks    PT Treatment/Interventions  ADLs/Self Care Home Management;Cryotherapy;Electrical Stimulation;Ultrasound;Moist Heat;Iontophoresis 89m/ml Dexamethasone;Therapeutic activities;Therapeutic exercise;Patient/family education;Neuromuscular re-education;Manual techniques;Taping;Dry needling    PT Next Visit Plan  Bilateral shoulders;  UE Ranger, pulleys for ROM;  add stretches to HEP;  moist heat/ultrasound to warm soft tissue for stretching bil shoulder; manual therapy for glenohumeral joint mobs and soft tissue; kinesiotape;  hold on ionto since recent injection       Patient will benefit from skilled therapeutic intervention in order to improve the following deficits and impairments:  Pain, Decreased  range of motion, Decreased strength, Hypomobility, Impaired UE functional use  Visit Diagnosis: Stiffness of right shoulder, not elsewhere classified  Stiffness of left shoulder, not elsewhere classified  Chronic right shoulder pain  Acute pain of left shoulder  Muscle  weakness (generalized)     Problem List Patient Active Problem List   Diagnosis Date Noted  . Adhesive capsulitis 12/03/2017  . DM (diabetes mellitus) type I controlled with eye manifestation ([place])     [name], PT 01/06/18 5:13 PM Phone: [phone] Fax: [phone]  Penny Andrade 01/06/2018, 5:11 PM  [facility] [address] [place], [place], [address] Phone: [phone]   Fax:  [phone]  Name: Penny Andrade MRN: [id number] Date of Birth: [birth year]

## 2018-01-07 ENCOUNTER — Encounter: Payer: Self-pay | Admitting: Physical Therapy

## 2018-01-07 ENCOUNTER — Ambulatory Visit: Payer: 59 | Admitting: Physical Therapy

## 2018-01-07 DIAGNOSIS — G8929 Other chronic pain: Secondary | ICD-10-CM | POA: Diagnosis not present

## 2018-01-07 DIAGNOSIS — M25512 Pain in left shoulder: Secondary | ICD-10-CM | POA: Diagnosis not present

## 2018-01-07 DIAGNOSIS — M25612 Stiffness of left shoulder, not elsewhere classified: Secondary | ICD-10-CM

## 2018-01-07 DIAGNOSIS — M25611 Stiffness of right shoulder, not elsewhere classified: Secondary | ICD-10-CM | POA: Diagnosis not present

## 2018-01-07 DIAGNOSIS — M6281 Muscle weakness (generalized): Secondary | ICD-10-CM | POA: Diagnosis not present

## 2018-01-07 DIAGNOSIS — M25511 Pain in right shoulder: Secondary | ICD-10-CM | POA: Diagnosis not present

## 2018-01-07 LAB — TB SKIN TEST
Induration: NEGATIVE mm
TB Skin Test: NEGATIVE

## 2018-01-07 NOTE — Patient Instructions (Signed)
   Strengthening: Resisted Internal Rotation   Hold tubing in left hand, elbow at side and forearm out. Rotate forearm in across body. Repeat _10___ times per set. Do __1__ sets per session. Do ____1 sessions per day.  http://orth.exer.us/830   Copyright  VHI. All rights reserved.  Strengthening: Resisted External Rotation   Hold tubing in right hand, elbow at side and forearm across body. Rotate forearm out. Repeat __10__ times per set. Do __1__ sets per session. Do _1___ sessions per day.  http://orth.exer.us/828   Copyright  VHI. All rights reserved.  Strengthening: Resisted Flexion   Hold tubing with left arm at side. Pull forward and up. Move shoulder through pain-free range of motion. Repeat __10__ times per set. Do _1___ sets per session. Do ___1_ sessions per day.  http://orth.exer.us/824   Copyright  VHI. All rights reserved.  Strengthening: Resisted Extension   Hold tubing in right hand, arm forward. Pull arm back, elbow straight. Repeat ___10_ times per set. Do _1___ sets per session. Do _1x day http://orth.exer.us/832   Copyright  VHI. All rights reserved.     [name] PT [facility] [address] [place], [place] [address] Phone # [phone] Fax [phone]

## 2018-01-07 NOTE — Therapy (Signed)
[facility] [address], [place], [place], [address] Phone: [phone]   Fax:  [phone]  Physical Therapy Treatment  Patient Details  Name: Penny Andrade MRN: [id number] Date of Birth: 11-27-1976 Referring Provider: Dr. Teresa Coombs   Encounter Date: 01/07/2018  PT End of Session - 01/07/18 1035    Visit Number  3    Date for PT Re-Evaluation  02/25/18    Authorization Type  cone [employer]    PT Start Time  0930    PT Stop Time  1015    PT Time Calculation (min)  45 min    Activity Tolerance  Patient tolerated treatment well       Past Medical History:  Diagnosis Date  . Diabetes mellitus without complication ([place])    diagnosed at 42 years old 51. Dr. Delrae Rend  . UTI (urinary tract infection)     Past Surgical History:  Procedure Laterality Date  . none      There were no vitals filed for this visit.  Subjective Assessment - 01/07/18 0927    Subjective  Patient denies soreness following treatment session for bilateral frozen shoulder and bursitis pain yesterday.  She reports her husband bought her a resistive band apparatus for home.  Reports her right shoulder is doing well since the injection.  She wonders if she will need a left shoulder injection when she sees the doctor next week 1/11.      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  No/denies    Pain Score  0-No pain 3-4/10 pain with overhead movement, behind back    Pain Location  Shoulder    Pain Orientation  Right;Left    Pain Type  Chronic pain    Aggravating Factors   behind back, overhead, lifting, lying on it    Pain Relieving Factors  amitryptiline         [facility] PT Assessment - 01/07/18 0001      AROM   Right Shoulder Flexion  150 Degrees    Right Shoulder ABduction  158 Degrees    Right Shoulder Internal Rotation  -- right iliac crest    Right Shoulder External Rotation  62  Degrees    Left Shoulder Flexion  153 Degrees    Left Shoulder ABduction  166 Degrees    Left Shoulder Internal Rotation  -- L5/S1    Left Shoulder External Rotation  83 Degrees                  [facility] Adult PT Treatment/Exercise - 01/07/18 0001      Shoulder Exercises: Supine   Flexion  AAROM;Right;Left;10 reps UE Ranger      Shoulder Exercises: Standing   External Rotation  Strengthening;Right;Left;10 reps;Theraband    Theraband Level (Shoulder External Rotation)  Level 2 (Red)    Internal Rotation  Strengthening;Right;Left;10 reps;Theraband    Theraband Level (Shoulder Internal Rotation)  Level 2 (Red)    Flexion  Strengthening;Right;Left;10 reps;Theraband    Theraband Level (Shoulder Flexion)  Level 2 (Red)    Row  Strengthening;Right;Left;15 reps;Theraband    Theraband Level (Shoulder Row)  Level 2 (Red)    Other Standing Exercises  UE Ranger on wall L7 and L12 10x each right/left    Other Standing Exercises  Internal rotation stretch on counter 5x bil      Shoulder Exercises: ROM/Strengthening   Other ROM/Strengthening Exercises  Pulleys 2 min flexion, 1 min scaption      Moist Heat Therapy   Number Minutes Moist Heat  15 Minutes concurrent with manual and some ex    Moist Heat Location  Shoulder      Manual Therapy   Manual therapy comments  passive flexion bil 10x with mild overpressure    Joint Mobilization  glenohumeral joint mobs grade 3 3x 20 sec bil    Soft tissue mobilization  --             PT Education - 01/07/18 1034    Education provided  Yes    Education Details  [place] modified    Person(s) Educated  Patient    Methods  Explanation;Demonstration;Handout    Comprehension  Verbalized understanding;Returned demonstration       PT Short Term Goals - 12/31/17 1547      PT SHORT TERM GOAL #1   Title  The patient will demonstrate basic self care strategies for ROM and pain control    Time  4    Period  Weeks    Status  New     Target Date  01/28/18      PT SHORT TERM GOAL #2   Title  The patient will demonstrate improved right shoulder flexion ROM to 145 and left to 155 degrees needed for overhead reaching    Time  4    Period  Weeks    Status  New      PT SHORT TERM GOAL #3   Title  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      Time  4    Period  Weeks    Status  New        PT Long Term Goals - 12/31/17 1550      PT LONG TERM GOAL #1   Title  The patient will be independent in safe self progression of HEP and appropriate gym program    Time  8    Period  Weeks    Status  New    Target Date  02/25/18      PT LONG TERM GOAL #2   Title  The patient will have improved right shoulder flexion to 150 and 160 on left for reaching overhead    Time  8    Period  Weeks    Status  New      PT LONG TERM GOAL #3   Title  Improved bilateral internal rotation to 45 degree or to mid lumbar region of the back needed for grooming/dressing    Time  8    Period  Weeks    Status  New      PT LONG TERM GOAL #4   Title  The patient will have improved shoulder abduction strength to 4/5 and other glenohumeral/scapular muscles to at least 4+/5 for lifting and carrying objects    Time  8    Period  Weeks    Status  New      PT LONG TERM GOAL #5   Title  50% improvement in overall pain for sleeping, 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  She has made gains in left shoulder ROM as well but not  as dramatic.   Her most painful motion is internal rotation behind the back.  Flexion is not as painful as it was initially.  Good response to moist heat with manual techniques and supine ROM.  She declines the need for end of session modalities for pain control.      Rehab Potential  Good    Clinical Impairments Affecting Rehab Potential  none    PT Frequency  2x / week    PT Duration  8 weeks    PT Treatment/Interventions  ADLs/Self Care Home Management;Cryotherapy;Electrical Stimulation;Ultrasound;Moist Heat;Iontophoresis 24m/ml Dexamethasone;Therapeutic activities;Therapeutic exercise;Patient/family education;Neuromuscular re-education;Manual techniques;Taping;Dry needling    PT Next Visit Plan  UE Ranger, pulleys for ROM;  stretches; moist heat or ultrasound to warm soft tissue for stretching bil shoulder; manual therapy for glenohumeral joint mobs and soft tissue;  Glenohumeral and scapular strengthening to maintain newly achieved ROM;   hold on ionto since recent injection       Patient will benefit from skilled therapeutic intervention in order to improve the following deficits and impairments:  Pain, Decreased range of motion, Decreased strength, Hypomobility, Impaired UE functional use  Visit Diagnosis: Stiffness of right shoulder, not elsewhere classified  Stiffness of left shoulder, not elsewhere classified  Chronic right shoulder pain  Acute pain of left shoulder  Muscle weakness (generalized)     Problem List Patient Active Problem List   Diagnosis Date Noted  . Adhesive capsulitis 12/03/2017  . DM (diabetes mellitus) type I controlled with eye manifestation ([facility]     SRuben Im PT 01/07/18 12:55 PM Phone: [phone]: [phone] SAlvera Singh1/10/2018, 12:54 PM  [facility] [address] [place] [address]: [phone]  Fax:  [phone] Name: CJENNAE HAKEEMMRN: [id number] of Birth: 406-Aug-1977

## 2018-01-10 ENCOUNTER — Ambulatory Visit: Payer: 59 | Admitting: Physical Therapy

## 2018-01-10 ENCOUNTER — Telehealth: Payer: Self-pay | Admitting: Physical Therapy

## 2018-01-10 DIAGNOSIS — E1065 Type 1 diabetes mellitus with hyperglycemia: Secondary | ICD-10-CM | POA: Diagnosis not present

## 2018-01-10 NOTE — Telephone Encounter (Signed)
No show. Pt states she attempted to call several times earlier today to cancel her appointment, but was unable to get a hold of anyone because the power was out at the clinic. She confirmed she will be at her next appointment.   [facility] [address], [place], [place] [address] Phone # [phone] Fax [phone]

## 2018-01-11 ENCOUNTER — Telehealth: Payer: Self-pay | Admitting: Family Medicine

## 2018-01-11 ENCOUNTER — Other Ambulatory Visit: Payer: Self-pay

## 2018-01-11 NOTE — Telephone Encounter (Signed)
Med is at [facility] They are filling it Pt called and notified

## 2018-01-11 NOTE — Telephone Encounter (Unsigned)
Copied from [place] [id number]. Topic: General - Other >> Jan 11, 2018  9:33 AM Neva Seat wrote: Amitriptyline 17m  [facility]  However [facility] an employee of [employer]    [facility] [phone]

## 2018-01-12 ENCOUNTER — Encounter: Payer: Self-pay | Admitting: Sports Medicine

## 2018-01-12 ENCOUNTER — Ambulatory Visit: Payer: Self-pay

## 2018-01-12 ENCOUNTER — Ambulatory Visit (INDEPENDENT_AMBULATORY_CARE_PROVIDER_SITE_OTHER): Payer: 59 | Admitting: Sports Medicine

## 2018-01-12 VITALS — BP 122/76 | HR 89 | Ht 70.0 in | Wt 198.0 lb

## 2018-01-12 DIAGNOSIS — E10311 Type 1 diabetes mellitus with unspecified diabetic retinopathy with macular edema: Secondary | ICD-10-CM | POA: Diagnosis not present

## 2018-01-12 DIAGNOSIS — M7501 Adhesive capsulitis of right shoulder: Secondary | ICD-10-CM

## 2018-01-12 DIAGNOSIS — M7502 Adhesive capsulitis of left shoulder: Secondary | ICD-10-CM | POA: Diagnosis not present

## 2018-01-12 NOTE — Procedures (Addendum)
PROCEDURE NOTE:  Ultrasound Guided: Injection -  Left shoulder, large-volume intra-articular injection Images were obtained and interpreted by myself, Teresa Coombs, DO  Images have been saved and stored to PACS system. Images obtained on: GE S7 Ultrasound machine  ULTRASOUND FINDINGS:    DESCRIPTION OF PROCEDURE:  The patient's clinical condition is marked by substantial pain and/or significant functional disability. Other conservative therapy has not provided relief, is contraindicated, or not appropriate. There is a reasonable likelihood that injection will significantly improve the patient's pain and/or functional impairment.  After discussing the risks, benefits and expected outcomes of the injection and all questions were reviewed and answered, the patient wished to undergo the above named procedure. Verbal consent was obtained.  The ultrasound was used to identify the target structure and adjacent neurovascular structures. The skin was then prepped in sterile fashion and the target structure was injected under direct visualization using sterile technique as below:  PREP: Alcohol, Ethel Chloride APPROACH: direct, stopcock technique, 22g 1.5in. INJECTATE: 5 cc 1% lidocaine, 2cc 0.5% marcaine, 1cc 61m/mL DepoMedrol   ASPIRATE: None   DRESSING: Band-Aid   Post procedural instructions including recommending icing and warning signs for infection were reviewed.  This procedure was well tolerated and there were no complications.   IMPRESSION: Succesful Ultrasound Guided: Injection

## 2018-01-12 NOTE — Progress Notes (Signed)
[name], [place] at [facility] [phone]  Penny Andrade - 42 y.o. female MRN [id number]  Date of birth: [birth year]  Visit Date: 01/12/2018  PCP: Marin Olp, MD   Referred by: Marin Olp, MD   Scribe for today's visit: Wendy Poet, LAT, ATC     SUBJECTIVE:  Penny Andrade is here for Follow-up (B shoulder pain) .   Notes from initial visit on 12/03/17: Her RT and LT shoulders symptoms INITIALLY: Began about 6+ months ago and she feels it could be related to carrying a heavy purse.  Described as mild numbness and heaviness, aching with certain movements. She denies clicking or popping. The pain hurts around the entire shoulder.  Worsened with sleeping on her sides, reaching across body, reaching back Improved after steroid injection at [facility] in August 2018.  Additional associated symptoms include: At times she will get a numb and heavy feeling in the RT arm. She denies neck pain.    At this time symptoms show no change compared to onset  She has been taking Ibuprofen with relief.  She has tried using heat with some relief.   Xray RT shoulder was done at [facility] in August 2018.  No recent x ray c-spine   Compared to the last office visit on 12/03/17, her previously described B shoulder pain symptoms are improving w/ less pain and improved ROM. (R shoulder ROM has improved more than L and L shoulder pain is >R).  Most of her L shoulder pain occurs when she lays on her L side. Current symptoms are mild & are radiating to R UE. She has been to outpatient PT x 3 visits and states that it is going well and is taking Amitryptaline.   ROS Reports night time disturbances if she lays on her L side. Denies fevers, chills, or night sweats. Denies unexplained weight loss. Denies personal history of cancer. Denies changes in bowel or bladder habits. Denies recent unreported falls. Denies new or  worsening dyspnea or wheezing. Denies headaches or dizziness.  Reports numbness, tingling or weakness  In the extremities, specifically in the R UE. Denies dizziness or presyncopal episodes Denies lower extremity edema     HISTORY & PERTINENT PRIOR DATA:  Prior History reviewed and updated per electronic medical record.  Significant history, findings, studies and interim changes include:  reports that  has never smoked. she has never used smokeless tobacco. Recent Labs    04/23/17 1116  HGBA1C 8.1   No specialty comments available. No problems updated.  OBJECTIVE:  VS:  HT:5' 10"  (177.8 cm)   WT:198 lb (89.8 kg)  BMI:28.41    BP:122/76  HR:89bpm  TEMP: ( )  RESP:99 %   PHYSICAL EXAM: Constitutional: WDWN, Non-toxic appearing. Psychiatric: Alert & appropriately interactive.  Not depressed or anxious appearing. Respiratory: No increased work of breathing.  Trachea Midline Eyes: Pupils are equal.  EOM intact without nystagmus.  No scleral icterus  NEUROVASCULAR exam: No clubbing or cyanosis appreciated No significant venous stasis changes Capillary Refill: normal, less than 2 seconds   Findings:  Bilateral shoulders have overall improved range of motion compared to last visit however persistent limitations with internal and external rotation with a total of 60 degrees arc compared in the past.  Rotator cuff strength is intact with internal and external rotation as well as abduction, empty can and speeds.    ASSESSMENT & PLAN:   1. Adhesive  capsulitis of both shoulders   2. 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  Large-volume intra-articular injection.  This was repeated today.   No problem-specific Assessment & Plan notes found for this  encounter.   ++++++++++++++++++++++++++++++++++++++++++++ Orders & Meds: Orders Placed This Encounter  Procedures  . US GUIDED NEEDLE PLACEMENT(NO LINKED CHARGES)  . [place] LIMITED JOINT SPACE STRUCTURES UP LEFT(NO LINKED CHARGES)    No orders of the defined types were placed in this encounter.   ++++++++++++++++++++++++++++++++++++++++++++ Follow-up: Return in about 6 weeks (around 02/23/2018).   Pertinent documentation may be included in additional procedure notes, imaging studies, problem based documentation and patient instructions. Please see these sections of the encounter for additional information regarding this visit. CMA/ATC served as Education administrator during this visit. History, Physical, and Plan performed by medical provider. Documentation and orders reviewed and attested to.      Gerda Diss, [place] [facility] Physician

## 2018-01-12 NOTE — Patient Instructions (Signed)

## 2018-01-13 ENCOUNTER — Ambulatory Visit: Payer: 59 | Admitting: Physical Therapy

## 2018-01-13 ENCOUNTER — Ambulatory Visit: Payer: 59 | Admitting: Sports Medicine

## 2018-01-13 DIAGNOSIS — G8929 Other chronic pain: Secondary | ICD-10-CM

## 2018-01-13 DIAGNOSIS — M25511 Pain in right shoulder: Secondary | ICD-10-CM

## 2018-01-13 DIAGNOSIS — M25512 Pain in left shoulder: Secondary | ICD-10-CM | POA: Diagnosis not present

## 2018-01-13 DIAGNOSIS — M25611 Stiffness of right shoulder, not elsewhere classified: Secondary | ICD-10-CM

## 2018-01-13 DIAGNOSIS — M6281 Muscle weakness (generalized): Secondary | ICD-10-CM

## 2018-01-13 DIAGNOSIS — M25612 Stiffness of left shoulder, not elsewhere classified: Secondary | ICD-10-CM | POA: Diagnosis not present

## 2018-01-13 NOTE — Therapy (Signed)
[facility] [address], [place], [place], [address] Phone: [phone]   Fax:  [phone]  Physical Therapy Treatment  Patient Details  Name: Penny Andrade MRN: [id number] Date of Birth: [birth year] Referring Provider: Dr. Teresa Coombs   Encounter Date: 01/13/2018  PT End of Session - 01/13/18 1448    Visit Number  4    Date for PT Re-Evaluation  02/25/18    Authorization Type  [employer]MR    PT Start Time  1400    PT Stop Time  1445    PT Time Calculation (min)  45 min    Activity Tolerance  Patient tolerated treatment well       Past Medical History:  Diagnosis Date  . Diabetes mellitus without complication ([place])    diagnosed at 42 years old 108. Dr. Delrae Rend  . UTI (urinary tract infection)     Past Surgical History:  Procedure Laterality Date  . none      There were no vitals filed for this visit.  Subjective Assessment - 01/13/18 1355    Subjective  Had a left shoulder injection yesterday.  Minimal soreness today but no real pain on either side.      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  No/denies    Pain Score  0-No pain    Pain Location  Shoulder    Pain Orientation  Right;Left                      [facility] Adult PT Treatment/Exercise - 01/13/18 0001      Shoulder Exercises: Supine   Flexion  AAROM;Right;Left;10 reps UE Ranger      Shoulder Exercises: Prone   Extension  AROM;Right;Left;10 reps    Horizontal ABduction 1  AROM;Right;Left;10 reps small arc of movement    Horizontal ABduction 2  AROM;Right;Left;10 reps small arc of movement      Shoulder Exercises: Standing   Other Standing Exercises  UE Ranger on wall L10 and L16 10x each right/left    Other Standing Exercises  UE Ranger on wall for internal rotation ROM 1 min each      Shoulder Exercises: ROM/Strengthening   Other ROM/Strengthening Exercises  Pulleys  1 min flexion, 1 min scaption      Moist Heat Therapy   Number Minutes Moist Heat  15 Minutes concurrent with manual therapy and some ex    Moist Heat Location  Shoulder      Manual Therapy   Manual therapy comments  passive flexion and scaption bil 10x with mild overpressure    Joint Mobilization  glenohumeral joint mobs grade 3 3x 20 sec bil; prone PA grade 2 right/left     Soft tissue mobilization  deltoids     Scapular Mobilization  sidelying scapular mob with her hand on her hip to facilitate internal rotation               PT Short Term Goals - 12/31/17 1547      PT SHORT TERM GOAL #1   Title  The patient will demonstrate basic self care strategies for ROM and pain control    Time  4    Period  Weeks    Status  New    Target Date  01/28/18      PT SHORT TERM GOAL #2   Title  The patient will demonstrate improved  right shoulder flexion ROM to 145 and left to 155 degrees needed for overhead reaching    Time  4    Period  Weeks    Status  New      PT SHORT TERM GOAL #3   Title  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      Time  4    Period  Weeks    Status  New        PT Long Term Goals - 12/31/17 1550      PT LONG TERM GOAL #1   Title  The patient will be independent in safe self progression of HEP and appropriate gym program    Time  8    Period  Weeks    Status  New    Target Date  02/25/18      PT LONG TERM GOAL #2   Title  The patient will have improved right shoulder flexion to 150 and 160 on left for reaching overhead    Time  8    Period  Weeks    Status  New      PT LONG TERM GOAL #3   Title  Improved bilateral internal rotation to 45 degree or to mid lumbar region of the back needed for grooming/dressing    Time  8    Period  Weeks    Status  New      PT LONG TERM GOAL #4   Title  The patient will have improved shoulder abduction strength to 4/5 and other glenohumeral/scapular  muscles to at least 4+/5 for lifting and carrying objects    Time  8    Period  Weeks    Status  New      PT LONG TERM GOAL #5   Title  50% improvement in overall pain for sleeping, 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  Her shoulder elevation ROM has improved significantly and now her main complaint is internal rotation behind the back to put on her coat.  With verbal and tactile cues she  is able to retract and depress scapula during prone exercises needed for stability with glenohumeral movements.       Rehab Potential  Good    Clinical Impairments Affecting Rehab Potential  none    PT Frequency  2x / week    PT Duration  8 weeks    PT Treatment/Interventions  ADLs/Self Care Home Management;Cryotherapy;Electrical Stimulation;Ultrasound;Moist Heat;Iontophoresis 31m/ml Dexamethasone;Therapeutic activities;Therapeutic exercise;Patient/family education;Neuromuscular re-education;Manual techniques;Taping;Dry needling    PT Next Visit Plan  UE Ranger, pulleys for ROM;  stretches; moist heat  to warm soft tissue for stretching bil shoulder; manual therapy for glenohumeral joint mobs and soft tissue;  review prone scapular exs and add to HEP;  internal rotation ROM on pulleys       Patient will benefit from skilled therapeutic intervention in order to improve the following deficits and impairments:  Pain, Decreased range of motion, Decreased strength, Hypomobility, Impaired UE functional use  Visit Diagnosis: Stiffness  of right shoulder, not elsewhere classified  Stiffness of left shoulder, not elsewhere classified  Chronic right shoulder pain  Acute pain of left shoulder  Muscle weakness (generalized)     Problem List Patient Active Problem List   Diagnosis Date Noted  . Adhesive capsulitis 12/03/2017  . DM (diabetes mellitus) type I controlled with eye manifestation ([place])    [name], PT 01/13/18 3:00 PM Phone: [phone] Fax: [phone]  [name] 01/13/2018, 2:59 PM  [facility] [address] [place], [place], [address] Phone: [phone]   Fax:  [phone]  Name: Penny Andrade MRN: [id number] Date of Birth: May 06, 1976

## 2018-01-14 ENCOUNTER — Telehealth: Payer: 59 | Admitting: Nurse Practitioner

## 2018-01-14 DIAGNOSIS — B9689 Other specified bacterial agents as the cause of diseases classified elsewhere: Secondary | ICD-10-CM | POA: Diagnosis not present

## 2018-01-14 DIAGNOSIS — N760 Acute vaginitis: Secondary | ICD-10-CM

## 2018-01-14 MED ORDER — METRONIDAZOLE 500 MG PO TABS: 500.0000 mg | ORAL_TABLET | Freq: Two times a day (BID) | ORAL | 0 refills | Status: DC

## 2018-01-14 NOTE — Progress Notes (Signed)

## 2018-01-19 ENCOUNTER — Ambulatory Visit: Payer: 59 | Admitting: Physical Therapy

## 2018-01-19 ENCOUNTER — Encounter: Payer: Self-pay | Admitting: Physical Therapy

## 2018-01-19 DIAGNOSIS — M25512 Pain in left shoulder: Secondary | ICD-10-CM

## 2018-01-19 DIAGNOSIS — M25612 Stiffness of left shoulder, not elsewhere classified: Secondary | ICD-10-CM | POA: Diagnosis not present

## 2018-01-19 DIAGNOSIS — G8929 Other chronic pain: Secondary | ICD-10-CM | POA: Diagnosis not present

## 2018-01-19 DIAGNOSIS — M25611 Stiffness of right shoulder, not elsewhere classified: Secondary | ICD-10-CM | POA: Diagnosis not present

## 2018-01-19 DIAGNOSIS — M6281 Muscle weakness (generalized): Secondary | ICD-10-CM | POA: Diagnosis not present

## 2018-01-19 DIAGNOSIS — M25511 Pain in right shoulder: Secondary | ICD-10-CM

## 2018-01-20 ENCOUNTER — Ambulatory Visit: Payer: 59 | Admitting: Physical Therapy

## 2018-01-20 DIAGNOSIS — M25511 Pain in right shoulder: Secondary | ICD-10-CM | POA: Diagnosis not present

## 2018-01-20 DIAGNOSIS — M25512 Pain in left shoulder: Secondary | ICD-10-CM | POA: Diagnosis not present

## 2018-01-20 DIAGNOSIS — M6281 Muscle weakness (generalized): Secondary | ICD-10-CM

## 2018-01-20 DIAGNOSIS — M25611 Stiffness of right shoulder, not elsewhere classified: Secondary | ICD-10-CM

## 2018-01-20 DIAGNOSIS — G8929 Other chronic pain: Secondary | ICD-10-CM

## 2018-01-20 DIAGNOSIS — M25612 Stiffness of left shoulder, not elsewhere classified: Secondary | ICD-10-CM

## 2018-01-20 NOTE — Therapy (Signed)
[facility] [address], [place], [place], [address] Phone: [phone]   Fax:  [phone]  Physical Therapy Treatment  Patient Details  Name: Penny Andrade MRN: [id number] Date of Birth: 1976/02/16 Referring Provider: Dr. Teresa Coombs   Encounter Date: 01/20/2018  PT End of Session - 01/20/18 1020    Visit Number  6    Date for PT Re-Evaluation  02/25/18    Authorization Type  cone [employer]    PT Start Time  0930    PT Stop Time  1015    PT Time Calculation (min)  45 min    Activity Tolerance  Patient tolerated treatment well       Past Medical History:  Diagnosis Date  . Diabetes mellitus without complication ([place])    diagnosed at 42 years old 53. Dr. Delrae Rend  . UTI (urinary tract infection)     Past Surgical History:  Procedure Laterality Date  . none      There were no vitals filed for this visit.  Subjective Assessment - 01/19/18 1619    Subjective  Pt reports that her injections seemed to help on the Rt, but the Lt will still bother her when she rotates the arm or rolls onto it when she is sleeping.     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  No/denies                      [facility] Adult PT Treatment/Exercise - 01/20/18 0001      Exercises   Exercises  Shoulder      Shoulder Exercises: Standing   Extension  PROM;Both;5 reps;15 reps    Other Standing Exercises  UE ranger L 20, 25, Lt and Rt shoulder flexion x15 reps, therapist providing scapular assist      Shoulder Exercises: Pulleys   Flexion      ABduction     Other Pulley Exercises      Moist Heat Therapy   Moist Heat Location  Shoulder Lt/Rt during manual treatment      Manual Therapy   Manual therapy comments  Rt and Lt shoulder passive IR hold 10x     Joint Mobilization  Rt and Lt inferior/posterior glenohumeral jt mobs, Grade III-IV; shoulder IR MWM 2x10 reps;   Belt distraction laterally with patient doing internal rotation      Scapular Mobilization  Scapula upward rotation and protraction mobilization       Pectoral corner stretch multi angle 3x 20 sec  Child's Pose with foam roll 5x  Thread the needle with foam roll 5x right/left  Prone Is, Ys, Ts right only 10x each     PT Education - 01/20/18 1020    Education provided  Yes    Education Details  prone Is,Ys, Ts    Person(s) Educated  Patient    Methods  Explanation;Handout    Comprehension  Verbalized understanding;Returned demonstration       PT Short Term Goals - 01/20/18 0755      PT SHORT TERM GOAL #1   Title  The patient will demonstrate basic self care strategies for ROM and pain control    Time  4    Period  Weeks    Status  Achieved      PT SHORT TERM GOAL #2   Title  The patient will demonstrate improved right shoulder flexion ROM  to 145 and left to 155 degrees needed for overhead reaching    Time  4    Period  Weeks    Status  Partially Met      PT SHORT TERM GOAL #3   Title  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      Time  4    Period  Weeks    Status  On-going        PT Long Term Goals - 12/31/17 1550      PT LONG TERM GOAL #1   Title  The patient will be independent in safe self progression of HEP and appropriate gym program    Time  8    Period  Weeks    Status  New    Target Date  02/25/18      PT LONG TERM GOAL #2   Title  The patient will have improved right shoulder flexion to 150 and 160 on left for reaching overhead    Time  8    Period  Weeks    Status  New      PT LONG TERM GOAL #3   Title  Improved bilateral internal rotation to 45 degree or to mid lumbar region of the back needed for grooming/dressing    Time  8    Period  Weeks    Status  New      PT LONG TERM GOAL #4   Title  The patient will have improved shoulder abduction strength to 4/5 and other  glenohumeral/scapular muscles to at least 4+/5 for lifting and carrying objects    Time  8    Period  Weeks    Status  New      PT LONG TERM GOAL #5   Title  50% improvement in overall pain for sleeping, 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  Primary pain at night time  with left sidelying but continues to be bothered by lack of internal rotation ROM behind back to put on her jacket.  Shoulder flexion/elevation ROM much improved.      Rehab Potential  Good    Clinical Impairments Affecting Rehab Potential  none    PT Frequency  2x / week    PT Treatment/Interventions  ADLs/Self Care Home Management;Cryotherapy;Electrical Stimulation;Ultrasound;Moist Heat;Iontophoresis 62m/ml Dexamethasone;Therapeutic activities;Therapeutic exercise;Patient/family education;Neuromuscular re-education;Manual techniques;Taping;Dry needling    PT Next Visit Plan  possible DN pec/lat regions; recheck ROM;  UE Ranger, pulleys for ROM;  stretches; moist heat  to warm soft tissue for stretching bil shoulder; manual therapy for glenohumeral joint mobs and soft tissue;  review prone scapular exs and add to HEP;  internal rotation ROM on pulleys       Patient will benefit from skilled therapeutic intervention  in order to improve the following deficits and impairments:  Pain, Decreased range of motion, Decreased strength, Hypomobility, Impaired UE functional use  Visit Diagnosis: Stiffness of right shoulder, not elsewhere classified  Stiffness of left shoulder, not elsewhere  classified  Chronic right shoulder pain  Acute pain of left shoulder  Muscle weakness (generalized)     Problem List Patient Active Problem List   Diagnosis Date Noted  . Adhesive capsulitis 12/03/2017  . DM (diabetes mellitus) type I controlled with eye manifestation ([place])    [name], PT 01/20/18 10:27 AM Phone: [phone] Fax: [phone]  [name] 01/20/2018, 10:25 AM  [place] [address] [place], [place], [address] Phone: [phone]   Fax:  [phone]  Name: Penny Andrade MRN: [id number] Date of Birth: Jul 13, 1976

## 2018-01-20 NOTE — Patient Instructions (Signed)
Penny Andrade PT [facility] [address], [address] [place], [place] [address] Phone # [phone] Fax [phone]    

## 2018-01-20 NOTE — Therapy (Signed)
[facility] [address], [place], [place], [address] Phone: [phone]   Fax:  [phone]  Physical Therapy Treatment  Patient Details  Name: Penny Andrade MRN: [id number] Date of Birth: [birth year] Referring Provider: Dr. Teresa Coombs   Encounter Date: 01/19/2018  PT End of Session - 01/19/18 1704    Visit Number  5    Date for PT Re-Evaluation  02/25/18    Authorization Type  cone [employer]MR    PT Start Time  1618    PT Stop Time  1700    PT Time Calculation (min)  42 min    Activity Tolerance  Patient tolerated treatment well       Past Medical History:  Diagnosis Date  . Diabetes mellitus without complication ([place])    diagnosed at 42 years old 28. Dr. Delrae Rend  . UTI (urinary tract infection)     Past Surgical History:  Procedure Laterality Date  . none      There were no vitals filed for this visit.  Subjective Assessment - 01/19/18 1619    Subjective  Pt reports that her injections seemed to help on the Rt, but the Lt will still bother her when she rotates the arm or rolls onto it when she is sleeping.     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  No/denies                      [facility] Adult PT Treatment/Exercise - 01/20/18 0001      Exercises   Exercises  Shoulder      Shoulder Exercises: Standing   Extension  PROM;Both;5 reps;15 reps    Other Standing Exercises  UE ranger L 24, Lt and Rt shoulder flexion x15 reps, therapist providing scapular assist      Shoulder Exercises: Pulleys   Flexion  3 minutes    ABduction  3 minutes    Other Pulley Exercises  Lt/Rt shoulder IR, x3 min each       Moist Heat Therapy   Moist Heat Location  Shoulder Lt/Rt during manual treatment      Manual Therapy   Manual therapy comments  Rt and Lt shoulder passive IR hold 10x     Joint Mobilization  Rt and Lt inferior/posterior glenohumeral jt  mobs, Grade III-IV; shoulder IR MWM 2x10 reps     Scapular Mobilization  Scapula upward rotation and protraction mobilization with movement during UE ranger             PT Education - 01/19/18 1704    Education provided  Yes    Education Details  scapula position during stretches    Person(s) Educated  Patient    Methods  Explanation    Comprehension  Verbalized understanding       PT Short Term Goals - 01/20/18 0755      PT SHORT TERM GOAL #1   Title  The patient will demonstrate basic self care strategies for ROM and pain control    Time  4    Period  Weeks    Status  Achieved      PT SHORT TERM GOAL #2   Title  The patient will demonstrate improved right shoulder flexion ROM to 145 and left to 155 degrees needed for overhead reaching    Time  4    Period  Weeks  Status  Partially Met      PT SHORT TERM GOAL #3   Title  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      Time  4    Period  Weeks    Status  On-going        PT Long Term Goals - 12/31/17 1550      PT LONG TERM GOAL #1   Title  The patient will be independent in safe self progression of HEP and appropriate gym program    Time  8    Period  Weeks    Status  New    Target Date  02/25/18      PT LONG TERM GOAL #2   Title  The patient will have improved right shoulder flexion to 150 and 160 on left for reaching overhead    Time  8    Period  Weeks    Status  New      PT LONG TERM GOAL #3   Title  Improved bilateral internal rotation to 45 degree or to mid lumbar region of the back needed for grooming/dressing    Time  8    Period  Weeks    Status  New      PT LONG TERM GOAL #4   Title  The patient will have improved shoulder abduction strength to 4/5 and other glenohumeral/scapular muscles to at least 4+/5 for lifting and carrying objects    Time  8    Period  Weeks    Status  New      PT LONG TERM GOAL #5   Title  50% improvement in  overall pain for sleeping, 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 Session focused on improving joint mobility throughout the shoulder. Noted increased muscle tension along the latissimus and pectoralis on both UEs, in addition to remaining limitations in glenohumeral mobility. Pt was able to complete therex without any report of increased pain end of session. Will consider possible dry needling in future sessions to address soft tissue related restrictions throughout the shoulder.     Rehab Potential  Good    Clinical Impairments Affecting Rehab Potential  none    PT Frequency  2x / week    PT Duration  8 weeks    PT Treatment/Interventions  ADLs/Self Care Home Management;Cryotherapy;Electrical Stimulation;Ultrasound;Moist Heat;Iontophoresis 64m/ml Dexamethasone;Therapeutic activities;Therapeutic exercise;Patient/family education;Neuromuscular re-education;Manual techniques;Taping;Dry needling    PT Next Visit Plan  possible DN pec/lat regions; UE Ranger, pulleys for ROM;  stretches; moist heat  to warm soft tissue for stretching bil shoulder; manual therapy for glenohumeral joint mobs and soft tissue;  review prone scapular exs and add to HEP;  internal rotation ROM on pulleys    Consulted and Agree with Plan of Care  Patient       Patient will benefit from skilled therapeutic intervention in order to improve the following deficits and impairments:  Pain, Decreased range of motion, Decreased strength,  Hypomobility, Impaired UE functional use  Visit Diagnosis: Stiffness of right shoulder, not elsewhere classified  Stiffness of left shoulder, not elsewhere classified  Chronic right shoulder pain  Acute pain of left shoulder  Muscle weakness (generalized)     Problem List Patient Active Problem List   Diagnosis Date Noted  . Adhesive capsulitis 12/03/2017  . DM (diabetes mellitus) type I controlled with eye manifestation ([place])     7:57 AM,01/20/18 Sherol Dade PT, DPT [facility] [address], [place], [place], [address] Phone: [phone]   Fax:  [phone]  Name: EVAROSE ALTLAND MRN: [id number] Date of Birth: 1976-02-08

## 2018-01-24 ENCOUNTER — Encounter: Payer: Self-pay | Admitting: Physical Therapy

## 2018-01-26 ENCOUNTER — Ambulatory Visit: Payer: 59 | Admitting: Physical Therapy

## 2018-01-26 ENCOUNTER — Encounter: Payer: Self-pay | Admitting: Physical Therapy

## 2018-01-26 DIAGNOSIS — M25612 Stiffness of left shoulder, not elsewhere classified: Secondary | ICD-10-CM

## 2018-01-26 DIAGNOSIS — M25512 Pain in left shoulder: Secondary | ICD-10-CM

## 2018-01-26 DIAGNOSIS — M25511 Pain in right shoulder: Secondary | ICD-10-CM

## 2018-01-26 DIAGNOSIS — G8929 Other chronic pain: Secondary | ICD-10-CM

## 2018-01-26 DIAGNOSIS — M6281 Muscle weakness (generalized): Secondary | ICD-10-CM

## 2018-01-26 DIAGNOSIS — M25611 Stiffness of right shoulder, not elsewhere classified: Secondary | ICD-10-CM

## 2018-01-26 NOTE — Patient Instructions (Signed)

## 2018-01-26 NOTE — Therapy (Signed)
[facility] [address] [place], [place], [address] Phone: [phone]   Fax:  [phone]  Physical Therapy Treatment  Patient Details  Name: Penny Andrade MRN: [id number] Date of Birth: [birth year] Referring Provider: Dr. Teresa Coombs   Encounter Date: 01/26/2018  PT End of Session - 01/26/18 1010    Visit Number  7    Date for PT Re-Evaluation  02/25/18    Authorization Type  cone UMR    PT Start Time  0934    PT Stop Time  1012    PT Time Calculation (min)  38 min    Activity Tolerance  Patient tolerated treatment well    Behavior During Therapy  [facility] for tasks assessed/performed       Past Medical History:  Diagnosis Date  . Diabetes mellitus without complication ([place])    diagnosed at 42 years old 55. Dr. Delrae Rend  . UTI (urinary tract infection)     Past Surgical History:  Procedure Laterality Date  . none      There were no vitals filed for this visit.  Subjective Assessment - 01/26/18 0933    Subjective  Pt reports that she is doing well, just some soreness in the shoulders over the past day or so. She continues to complete her exercises at home.     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  No/denies         [facility] PT Assessment - 01/26/18 0001      AROM   Right Shoulder Flexion  152 Degrees    Right Shoulder ABduction  155 Degrees (+) shoulder shrug    Left Shoulder Flexion  155 Degrees    Left Shoulder ABduction  165 Degrees           [facility] Adult PT Treatment/Exercise - 01/26/18 0001      Shoulder Exercises: Pulleys   Flexion  2 minutes BUE    ABduction  2 minutes BUE    Other Pulley Exercises  Lt/Rt shoulder IR x2 min      Shoulder Exercises: Stretch   Other Shoulder Stretches  Lt/Rt shoulder threading over foam roll x10 reps each    Other Shoulder Stretches  child's pose x5 reps for 20 sec       Moist Heat Therapy    Moist Heat Location  Shoulder Alternating Lt and Rt during manual treatment      Manual Therapy   Joint Mobilization  Rt/Lt shoulder IR MWM    Soft tissue mobilization  STM teres/lats Lt/Rt    Scapular Mobilization  all directions              PT Education - 01/26/18 1045    Education provided  Yes    Education Details  dry needling info     Person(s) Educated  Patient    Methods  Explanation;Handout    Comprehension  Verbalized understanding       PT Short Term Goals - 01/20/18 0755      PT SHORT TERM GOAL #1   Title  The patient will demonstrate basic self care strategies for ROM and pain control    Time  4    Period  Weeks    Status  Achieved      PT SHORT TERM GOAL #2   Title  The patient will demonstrate improved right shoulder flexion ROM to 145 and left  to 155 degrees needed for overhead reaching    Time  4    Period  Weeks    Status  Partially Met      PT SHORT TERM GOAL #3   Title  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      Time  4    Period  Weeks    Status  On-going        PT Long Term Goals - 12/31/17 1550      PT LONG TERM GOAL #1   Title  The patient will be independent in safe self progression of HEP and appropriate gym program    Time  8    Period  Weeks    Status  New    Target Date  02/25/18      PT LONG TERM GOAL #2   Title  The patient will have improved right shoulder flexion to 150 and 160 on left for reaching overhead    Time  8    Period  Weeks    Status  New      PT LONG TERM GOAL #3   Title  Improved bilateral internal rotation to 45 degree or to mid lumbar region of the back needed for grooming/dressing    Time  8    Period  Weeks    Status  New      PT LONG TERM GOAL #4   Title  The patient will have improved shoulder abduction strength to 4/5 and other glenohumeral/scapular muscles to at least 4+/5 for lifting and carrying objects    Time  8    Period  Weeks     Status  New      PT LONG TERM GOAL #5   Title  50% improvement in overall pain for sleeping, 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 Shoulder flexion ROM remains between 150-160 deg with positive shoulder shrug on the Rt greater than the Lt. Continued with manual treatment to address joint and soft tissue restrictions along B shoulders. Discussed dry needling and provided pt with handout concerning this. Will plan to complete dry needling treatment next session to further address restrictions throughout the shoulder girdle. Pt agreeable to this, and reported slight increase in shoulder discomfort following manual treatment.     Rehab Potential  Good    Clinical Impairments Affecting Rehab Potential  none    PT Frequency  2x / week    PT Treatment/Interventions  ADLs/Self Care Home Management;Cryotherapy;Electrical Stimulation;Ultrasound;Moist Heat;Iontophoresis 31m/ml Dexamethasone;Therapeutic activities;Therapeutic exercise;Patient/family education;Neuromuscular re-education;Manual techniques;Taping;Dry needling    PT Next Visit Plan  DN lats/teres/subscap; progress shoulder ROM and scapula control/strength    PT Home Exercise Plan  MD signed orders     Consulted and Agree with  Plan of Care  Patient       Patient will benefit from skilled therapeutic intervention in order to improve the following deficits and impairments:  Pain, Decreased range of motion, Decreased strength, Hypomobility, Impaired UE functional use  Visit Diagnosis: Stiffness of right  shoulder, not elsewhere classified  Stiffness of left shoulder, not elsewhere classified  Chronic right shoulder pain  Acute pain of left shoulder  Muscle weakness (generalized)     Problem List Patient Active Problem List   Diagnosis Date Noted  . Adhesive capsulitis 12/03/2017  . DM (diabetes mellitus) type I controlled with eye manifestation ([place])     11:18 AM,01/26/18 Sherol Dade PT, DPT [facility] [address], [place], [place], [address] Phone: [phone]   Fax:  [phone]  Name: Penny Andrade MRN: [id number] Date of Birth: 1976-10-02

## 2018-01-27 ENCOUNTER — Encounter: Payer: 59 | Admitting: Physical Therapy

## 2018-02-01 ENCOUNTER — Encounter: Payer: Self-pay | Admitting: Family

## 2018-02-01 ENCOUNTER — Ambulatory Visit: Payer: 59 | Attending: General Practice | Admitting: Physical Therapy

## 2018-02-01 ENCOUNTER — Encounter: Payer: Self-pay | Admitting: Physical Therapy

## 2018-02-01 ENCOUNTER — Ambulatory Visit (INDEPENDENT_AMBULATORY_CARE_PROVIDER_SITE_OTHER): Payer: Self-pay | Admitting: Family

## 2018-02-01 ENCOUNTER — Encounter: Payer: Self-pay | Admitting: Sports Medicine

## 2018-02-01 VITALS — BP 124/78 | HR 118 | Temp 99.7°F | Resp 18 | Wt 190.0 lb

## 2018-02-01 DIAGNOSIS — M6281 Muscle weakness (generalized): Secondary | ICD-10-CM | POA: Diagnosis not present

## 2018-02-01 DIAGNOSIS — R6889 Other general symptoms and signs: Secondary | ICD-10-CM

## 2018-02-01 DIAGNOSIS — M25611 Stiffness of right shoulder, not elsewhere classified: Secondary | ICD-10-CM | POA: Diagnosis not present

## 2018-02-01 DIAGNOSIS — M25512 Pain in left shoulder: Secondary | ICD-10-CM | POA: Diagnosis not present

## 2018-02-01 DIAGNOSIS — M25612 Stiffness of left shoulder, not elsewhere classified: Secondary | ICD-10-CM | POA: Diagnosis not present

## 2018-02-01 DIAGNOSIS — G8929 Other chronic pain: Secondary | ICD-10-CM | POA: Insufficient documentation

## 2018-02-01 DIAGNOSIS — M25511 Pain in right shoulder: Secondary | ICD-10-CM | POA: Insufficient documentation

## 2018-02-01 LAB — POCT INFLUENZA A/B
Influenza A, POC: NEGATIVE
Influenza B, POC: NEGATIVE

## 2018-02-01 MED ORDER — OSELTAMIVIR PHOSPHATE 75 MG PO CAPS: 75.0000 mg | ORAL_CAPSULE | Freq: Two times a day (BID) | ORAL | 0 refills | Status: DC

## 2018-02-01 NOTE — Therapy (Signed)
[facility] [address] [place], [place], [address] Phone: [phone]   Fax:  [phone]  Physical Therapy Treatment  Patient Details  Name: Penny Andrade MRN: [id number] Date of Birth: [birth year] Referring Provider: Dr. Teresa Coombs   Encounter Date: 02/01/2018  PT End of Session - 02/01/18 1617    Visit Number  8    Date for PT Re-Evaluation  02/25/18    Authorization Type  cone [employer]    PT Start Time  0800    PT Stop Time  0845    PT Time Calculation (min)  45 min    Activity Tolerance  Patient tolerated treatment well       Past Medical History:  Diagnosis Date  . Diabetes mellitus without complication ([place])    diagnosed at 42 years old 35. Dr. Delrae Rend  . UTI (urinary tract infection)     Past Surgical History:  Procedure Laterality Date  . none      There were no vitals filed for this visit.  Subjective Assessment - 02/01/18 0805    Subjective  Still left shoulder pain when I lie on it.  I have an upper respiratory infection.  I had to get a bra that hooks in the front.      Currently in Pain?  No/denies    Pain Score  0-No pain    Pain Location  Shoulder    Pain Orientation  Right;Left    Aggravating Factors   lying on left side, behind the back         [facility] PT Assessment - 02/01/18 0001      AROM   Right Shoulder Flexion  170 Degrees    Right Shoulder ABduction  150 Degrees    Right Shoulder Internal Rotation  -- L5    Right Shoulder External Rotation  70 Degrees                  [facility] Adult PT Treatment/Exercise - 02/01/18 0001      Shoulder Exercises: Prone   Other Prone Exercises  over green ball Is, Ys Ts no weight 10x each bil      Shoulder Exercises: Standing   Other Standing Exercises  UE ranger L 24, Lt and Rt shoulder flexion x15 reps, therapist providing scapular assist    Other Standing Exercises  UE Ranger on wall for internal rotation 10x  right/left      Moist Heat Therapy   Number Minutes Moist Heat  10 Minutes    Moist Heat Location  Shoulder concurrent right shoulder with left manual therapy      Manual Therapy   Joint Mobilization  Lt shoulder IR MWM    Soft tissue mobilization  STM teres/lats Lt/Rt       Trigger Point Dry Needling - 02/01/18 1612    Consent Given?  Yes    Education Handout Provided  Yes    Muscles Treated Upper Body  Upper trapezius;Infraspinatus;Subscapularis right teres    Upper Trapezius Response  Palpable increased muscle length    Infraspinatus Response  Palpable increased muscle length    Subscapularis Response  Palpable increased muscle length        Right side only for DN   PT Education - 02/01/18 1617    Education provided  Yes    Education Details  dry needling info     Person(s) Educated  Patient    Methods  Explanation;Demonstration;Handout  Comprehension  Verbalized understanding;Returned demonstration       PT Short Term Goals - 02/01/18 1709      PT SHORT TERM GOAL #1   Title  The patient will demonstrate basic self care strategies for ROM and pain control    Status  Achieved      PT SHORT TERM GOAL #2   Title  The patient will demonstrate improved right shoulder flexion ROM to 145 and left to 155 degrees needed for overhead reaching    Status  Achieved      PT SHORT TERM GOAL #3   Title  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        PT Long Term Goals - 12/31/17 1550      PT LONG TERM GOAL #1   Title  The patient will be independent in safe self progression of HEP and appropriate gym program    Time  8    Period  Weeks    Status  New    Target Date  02/25/18      PT LONG TERM GOAL #2   Title  The patient will have improved right shoulder flexion to 150 and 160 on left for reaching overhead    Time  8    Period  Weeks    Status  New      PT LONG TERM GOAL #3   Title   Improved bilateral internal rotation to 45 degree or to mid lumbar region of the back needed for grooming/dressing    Time  8    Period  Weeks    Status  New      PT LONG TERM GOAL #4   Title  The patient will have improved shoulder abduction strength to 4/5 and other glenohumeral/scapular muscles to at least 4+/5 for lifting and carrying objects    Time  8    Period  Weeks    Status  New      PT LONG TERM GOAL #5   Title  50% improvement in overall pain for sleeping, 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  Improving scapular stability and mobility as well.  She expresses interest in DN left shoulder next visit.  All STGs met.      Rehab Potential  Good    Clinical Impairments Affecting Rehab Potential  none    PT Frequency  2x / week    PT Duration  8 weeks    PT Treatment/Interventions  ADLs/Self Care Home Management;Cryotherapy;Electrical Stimulation;Ultrasound;Moist Heat;Iontophoresis 101m/ml Dexamethasone;Therapeutic activities;Therapeutic exercise;Patient/family education;Neuromuscular re-education;Manual techniques;Taping;Dry needling    PT Next Visit Plan  DN lats/teres/subscap to left;   progress shoulder ROM and scapula  control/strength       Patient will benefit from skilled therapeutic intervention in order to improve the following deficits and impairments:  Pain, Decreased range of motion, Decreased strength, Hypomobility, Impaired UE functional use  Visit Diagnosis: Stiffness of right shoulder,  not elsewhere classified  Stiffness of left shoulder, not elsewhere classified  Chronic right shoulder pain  Acute pain of left shoulder  Muscle weakness (generalized)     Problem List Patient Active Problem List   Diagnosis Date Noted  . Adhesive capsulitis 12/03/2017  . DM (diabetes mellitus) type I controlled with eye manifestation ([place])     [name], PT 02/01/18 5:10 PM Phone: [phone] Fax: [phone]  [name] 02/01/2018, 5:10 PM  [facility] [address] [place], [place], [address] Phone: [phone]   Fax:  [phone]  Name: SERINA NICHTER MRN: [id number] Date of Birth: May 18, 1976

## 2018-02-01 NOTE — Patient Instructions (Signed)
     Trigger Point Dry Needling  . What is Trigger Point Dry Needling (DN)? o DN is a physical therapy technique used to treat muscle pain and dysfunction. Specifically, DN helps deactivate muscle trigger points (muscle knots).  o A thin filiform needle is used to penetrate the skin and stimulate the underlying trigger point. The goal is for a local twitch response (LTR) to occur and for the trigger point to relax. No medication of any kind is injected during the procedure.   . What Does Trigger Point Dry Needling Feel Like?  o The procedure feels different for each individual patient. Some patients report that they do not actually feel the needle enter the skin and overall the process is not painful. Very mild bleeding may occur. However, many patients feel a deep cramping in the muscle in which the needle was inserted. This is the local twitch response.   Marland Kitchen How Will I feel after the treatment? o Soreness is normal, and the onset of soreness may not occur for a few hours. Typically this soreness does not last longer than two days.  o Bruising is uncommon, however; ice can be used to decrease any possible bruising.  o In rare cases feeling tired or nauseous after the treatment is normal. In addition, your symptoms may get worse before they get better, this period will typically not last longer than 24 hours.   . What Can I do After My Treatment? o Increase your hydration by drinking more water for the next 24 hours. o You may place ice or heat on the areas treated that have become sore, however, do not use heat on inflamed or bruised areas. Heat often brings more relief post needling. o You can continue your regular activities, but vigorous activity is not recommended initially after the treatment for 24 hours. o DN is best combined with other physical therapy such as strengthening, stretching, and other therapies.    [name] PT [facility] [address], [place], [place] [address] Phone # [phone] Fax [phone]

## 2018-02-01 NOTE — Progress Notes (Signed)
   Subjective:    Patient ID: Penny Andrade, female    DOB: 11-24-1976, 42 y.o.   MRN: [id number]  Cough  This is a new problem. The problem has been gradually worsening. The problem occurs every few minutes. The cough is non-productive. Associated symptoms include chills, a fever and myalgias. Pertinent negatives include no ear congestion, ear pain, rhinorrhea or sore throat. She has tried rest and OTC cough suppressant for the symptoms. The treatment provided mild relief. There is no history of asthma.      Review of Systems  Constitutional: Positive for chills and fever.  HENT: Negative for ear pain, rhinorrhea and sore throat.   Respiratory: Positive for cough.   Musculoskeletal: Positive for myalgias.  All other systems reviewed and are negative.      Objective:   Physical Exam  Constitutional: She is oriented to person, place, and time. She appears well-developed and well-nourished. No distress.  HENT:  Head: Normocephalic.  Right Ear: External ear normal.  Left Ear: External ear normal.  Nose: Mucosal edema and rhinorrhea present.  Mouth/Throat: Posterior oropharyngeal erythema present.  Eyes: Pupils are equal, round, and reactive to light.  Neck: Normal range of motion. Neck supple. No thyromegaly present.  Cardiovascular: Normal rate, regular rhythm, normal heart sounds and intact distal pulses.  No murmur heard. Pulmonary/Chest: Effort normal and breath sounds normal. No respiratory distress. She has no wheezes.  Constant dry nonproductive cough  Abdominal: Soft. Bowel sounds are normal. She exhibits no distension. There is no tenderness.  Musculoskeletal: Normal range of motion. She exhibits no edema or tenderness.  Neurological: She is alert and oriented to person, place, and time. She has normal reflexes. No cranial nerve deficit.  Skin: Skin is warm and dry.  Psychiatric: She has a normal mood and affect. Her behavior is normal. Judgment and thought content  normal.  Vitals reviewed.     BP 124/78 (BP Location: Right Arm, Patient Position: Sitting, Cuff Size: Normal)   Pulse (!) 118   Temp 99.7 F (37.6 C) (Oral)   Resp 18   Wt 190 lb (86.2 kg)   SpO2 98%   BMI 27.26 kg/m      Assessment & Plan:  1. Flu-like symptoms Force fluids Rest Tylenol or motrin prn  Good hand hygiene and cover mouth when coughing RTO prn  - oseltamivir (TAMIFLU) 75 MG capsule; Take 1 capsule (75 mg total) by mouth 2 (two) times daily.  Dispense: 10 capsule; Refill: 0    Evelina Dun, FNP

## 2018-02-01 NOTE — Addendum Note (Signed)
Addended by: Olin Pia on: 02/01/2018 06:58 PM   Modules accepted: Orders

## 2018-02-01 NOTE — Patient Instructions (Signed)

## 2018-02-03 ENCOUNTER — Telehealth: Payer: Self-pay

## 2018-02-03 ENCOUNTER — Ambulatory Visit: Payer: 59 | Admitting: Physical Therapy

## 2018-02-03 ENCOUNTER — Encounter: Payer: Self-pay | Admitting: Physical Therapy

## 2018-02-03 DIAGNOSIS — M25512 Pain in left shoulder: Secondary | ICD-10-CM

## 2018-02-03 DIAGNOSIS — M25611 Stiffness of right shoulder, not elsewhere classified: Secondary | ICD-10-CM | POA: Diagnosis not present

## 2018-02-03 DIAGNOSIS — M25612 Stiffness of left shoulder, not elsewhere classified: Secondary | ICD-10-CM

## 2018-02-03 DIAGNOSIS — G8929 Other chronic pain: Secondary | ICD-10-CM | POA: Diagnosis not present

## 2018-02-03 DIAGNOSIS — M25511 Pain in right shoulder: Secondary | ICD-10-CM | POA: Diagnosis not present

## 2018-02-03 DIAGNOSIS — M6281 Muscle weakness (generalized): Secondary | ICD-10-CM

## 2018-02-03 NOTE — Therapy (Addendum)
[facility] [address] [place], [place], [address] Phone: [phone]   Fax:  [phone]  Physical Therapy Treatment  Patient Details  Name: Penny Andrade MRN: [id number] Date of Birth: 04/29/1976 Referring Provider: Dr. Teresa Coombs   Encounter Date: 02/03/2018  PT End of Session - 02/03/18 1208    Visit Number  9   Date for PT Re-Evaluation  02/25/18    Authorization Type  cone [employer]    PT Start Time  0805    PT Stop Time  0845    PT Time Calculation (min)  40 min    Activity Tolerance  Patient tolerated treatment well       Past Medical History:  Diagnosis Date  . Diabetes mellitus without complication ([place])    diagnosed at 42 years old 108. Dr. Delrae Rend  . UTI (urinary tract infection)     Past Surgical History:  Procedure Laterality Date  . none      There were no vitals filed for this visit.  Subjective Assessment - 02/03/18 0803    Subjective  I'm doing better.  Denies excessive soreness from DN on right.  Left hurts with left sidelying at night.  Patient demonstrates improved right shoulder internal rotation ROM.     Currently in Pain?  No/denies    Pain Score  0-No pain         [facility] PT Assessment - 02/03/18 0001      AROM   Left Shoulder Flexion  163 Degrees    Left Shoulder ABduction  160 Degrees    Left Shoulder Internal Rotation  -- T10    Left Shoulder External Rotation  80 Degrees                  [facility] Adult PT Treatment/Exercise - 02/03/18 0001      Shoulder Exercises: Standing   Other Standing Exercises  UE Ranger L25     Other Standing Exercises  red band walks, steering wheel 10x each      Shoulder Exercises: ROM/Strengthening   Other ROM/Strengthening Exercises  3 way towel slides keeping isometric pressure 5x bil    Other ROM/Strengthening Exercises  kneeling "around the world" for ROM 5x right/left      Shoulder Exercises: Stretch   Other Shoulder  Stretches  Lt/Rt shoulder threading over foam roll x10 reps each    Other Shoulder Stretches  child's pose x5 reps for 20 sec       Moist Heat Therapy   Number Minutes Moist Heat  10 Minutes    Moist Heat Location  Shoulder concurrent right shoulder with left manual therapy      Manual Therapy   Joint Mobilization  right  shoulder IR MWM    Soft tissue mobilization  STM teres/lats Lt/Rt       Trigger Point Dry Needling - 02/03/18 1207    Consent Given?  Yes    Upper Trapezius Response  Twitch reponse elicited;Palpable increased muscle length    Infraspinatus Response  Twitch response elicited;Palpable increased muscle length    Subscapularis Response  Twitch response elicited;Palpable increased muscle length       Left only      PT Short Term Goals - 02/03/18 1215      PT SHORT TERM GOAL #1   Title  The patient will demonstrate basic self care strategies for ROM and pain control    Status  Achieved  PT SHORT TERM GOAL #2   Title  The patient will demonstrate improved right shoulder flexion ROM to 145 and left to 155 degrees needed for overhead reaching    Status  Achieved      PT SHORT TERM GOAL #3   Title  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        PT Long Term Goals - 12/31/17 1550      PT LONG TERM GOAL #1   Title  The patient will be independent in safe self progression of HEP and appropriate gym program    Time  8    Period  Weeks    Status  New    Target Date  02/25/18      PT LONG TERM GOAL #2   Title  The patient will have improved right shoulder flexion to 150 and 160 on left for reaching overhead    Time  8    Period  Weeks    Status  New      PT LONG TERM GOAL #3   Title  Improved bilateral internal rotation to 45 degree or to mid lumbar region of the back needed for grooming/dressing    Time  8    Period  Weeks    Status  New      PT LONG TERM GOAL #4    Title  The patient will have improved shoulder abduction strength to 4/5 and other glenohumeral/scapular muscles to at least 4+/5 for lifting and carrying objects    Time  8    Period  Weeks    Status  New      PT LONG TERM GOAL #5   Title  50% improvement in overall pain for sleeping, 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   Muscular fatigue with scapular strengthening exercises.  Decreasing pain overall however left sidelying is still painful.      Rehab Potential  Good    Clinical Impairments Affecting Rehab Potential  none    PT Frequency  2x / week    PT Duration  8 weeks    PT Treatment/Interventions  ADLs/Self Care Home Management;Cryotherapy;Electrical Stimulation;Ultrasound;Moist Heat;Iontophoresis 60m/ml Dexamethasone;Therapeutic activities;Therapeutic exercise;Patient/family education;Neuromuscular re-education;Manual techniques;Taping;Dry needling    PT Next Visit Plan  DN lats/teres/subscap;   progress shoulder ROM and scapula control/strength       Patient will benefit from skilled therapeutic intervention in order to improve the following deficits and impairments:  Pain, Decreased range of motion, Decreased strength, Hypomobility, Impaired UE functional use  Visit Diagnosis: Stiffness of right shoulder, not elsewhere classified  Stiffness of left shoulder, not elsewhere classified  Chronic right shoulder pain  Acute pain of left shoulder  Muscle weakness  (generalized)     Problem List Patient Active Problem List   Diagnosis Date Noted  . Adhesive capsulitis 12/03/2017  . DM (diabetes mellitus) type I controlled with eye manifestation ([place])    [name], PT 02/03/18 12:18 PM Phone: [phone] Fax: [phone]  [name] 02/03/2018, 12:17 PM  [facility] [address] [place], [place], [address] Phone: [phone]   Fax:  [phone]  Name: Penny Andrade MRN: [id number] Date of Birth: 1976/04/12

## 2018-02-03 NOTE — Telephone Encounter (Signed)
Called to see how pt was doing since her visit with [place] this past week and pt states she is doing better.

## 2018-02-08 ENCOUNTER — Encounter: Payer: Self-pay | Admitting: Physical Therapy

## 2018-02-08 ENCOUNTER — Ambulatory Visit: Payer: 59 | Admitting: Physical Therapy

## 2018-02-08 DIAGNOSIS — M6281 Muscle weakness (generalized): Secondary | ICD-10-CM

## 2018-02-08 DIAGNOSIS — M25512 Pain in left shoulder: Secondary | ICD-10-CM | POA: Diagnosis not present

## 2018-02-08 DIAGNOSIS — G8929 Other chronic pain: Secondary | ICD-10-CM | POA: Diagnosis not present

## 2018-02-08 DIAGNOSIS — M25612 Stiffness of left shoulder, not elsewhere classified: Secondary | ICD-10-CM | POA: Diagnosis not present

## 2018-02-08 DIAGNOSIS — M25611 Stiffness of right shoulder, not elsewhere classified: Secondary | ICD-10-CM

## 2018-02-08 DIAGNOSIS — M25511 Pain in right shoulder: Secondary | ICD-10-CM

## 2018-02-08 NOTE — Therapy (Signed)
[facility] [address], [place], [place], [address] Phone: [phone]   Fax:  [phone]  Physical Therapy Treatment  Patient Details  Name: Penny Andrade MRN: [id number] Date of Birth: 1976/12/06 Referring Provider: Dr. Teresa Coombs   Encounter Date: 02/08/2018  PT End of Session - 02/08/18 1930    Visit Number  10    Date for PT Re-Evaluation  02/25/18    Authorization Type  cone [employer]MR    PT Start Time  0933    PT Stop Time  1015    PT Time Calculation (min)  42 min    Activity Tolerance  Patient tolerated treatment well       Past Medical History:  Diagnosis Date  . Diabetes mellitus without complication ([place])    diagnosed at 42 years old 29. Dr. Delrae Rend  . UTI (urinary tract infection)     Past Surgical History:  Procedure Laterality Date  . none      There were no vitals filed for this visit.  Subjective Assessment - 02/08/18 0932    Subjective  Left shoulder feels great.  Saturday I was curling my hair and it fell and I tried to catch it and flared up my right shoulder.  Hurts to lie on the right shoulder now.  The motion on both sides is getting better.      Currently in Pain?  No/denies    Pain Score  0-No pain                      [facility] Adult PT Treatment/Exercise - 02/08/18 0001      Shoulder Exercises: Standing   Protraction  -- red ball on wall single arm circles 20x each arm tiptoes    Other Standing Exercises  UE Ranger L25 flexion and scaption 10x each bil    Other Standing Exercises  yellow band tall table "side steps"  10x right/left      Shoulder Exercises: Stretch   Other Shoulder Stretches  Lt/Rt shoulder threading over foam roll x10 reps each    Other Shoulder Stretches  child's pose x5 reps for 20 sec  with foam roll      Moist Heat Therapy   Number Minutes Moist Heat  8 Minutes    Moist Heat Location  Shoulder concurrent left shoulder with right  manual therapy      Manual Therapy   Joint Mobilization  left glenohumeral distraction, inferior and posterior grade 3 mobs and mobs with movement     Soft tissue mobilization  right infraspinatus, teres     Kinesiotex  Facilitate Muscle      Kinesiotix   Facilitate Muscle   3 strips:posterior deltoid, anterior deltoid and horizontal strip across middle deltoid right only      Trigger Point Dry Needling - 02/08/18 1913    Consent Given?  Yes    Muscles Treated Upper Body  -- teres major and minor right/left    Upper Trapezius Response  Twitch reponse elicited;Palpable increased muscle length    Infraspinatus Response  Twitch response elicited;Palpable increased muscle length      DN on right only.         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        PT Long Term Goals - 12/31/17 1550      PT LONG TERM GOAL #1   Title  The patient will be independent in safe self progression of HEP and appropriate gym program    Time  8    Period  Weeks    Status  New    Target Date  02/25/18      PT LONG TERM GOAL #2   Title  The patient will have improved right shoulder flexion to 150 and 160 on left for reaching overhead    Time  8    Period  Weeks    Status  New      PT LONG TERM GOAL #3   Title  Improved bilateral internal rotation to 45 degree or to mid lumbar region of the back needed for grooming/dressing    Time  8    Period  Weeks    Status  New      PT LONG TERM GOAL #4   Title  The patient will  have improved shoulder abduction strength to 4/5 and other glenohumeral/scapular muscles to at least 4+/5 for lifting and carrying objects    Time  8    Period  Weeks    Status  New      PT LONG TERM GOAL #5   Title  50% improvement in overall pain for sleeping, 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  Her ROM in both shoulders continues to improve.  Therapist closely monitoring response and providing cues to activate serratus anterior with wall exercises.      Rehab Potential  Good    Clinical Impairments Affecting Rehab Potential  none    PT Frequency  2x / week    PT Duration  8 weeks    PT Treatment/Interventions  ADLs/Self Care Home Management;Cryotherapy;Electrical Stimulation;Ultrasound;Moist Heat;Iontophoresis 61m/ml Dexamethasone;Therapeutic activities;Therapeutic exercise;Patient/family education;Neuromuscular re-education;Manual techniques;Taping;Dry needling    PT Next Visit Plan  recheck ROM;  assess response to DN right shoulder and DN left if needed;  kinesiotape as needed;  bil shoulder ROM for flexion and internal rotation;  scapular and rotator cuff strengthening       Patient will benefit from skilled therapeutic intervention in order to improve the following deficits  and impairments:  Pain, Decreased range of motion, Decreased strength, Hypomobility, Impaired UE functional use  Visit Diagnosis: Stiffness of right shoulder, not elsewhere  classified  Stiffness of left shoulder, not elsewhere classified  Chronic right shoulder pain  Acute pain of left shoulder  Muscle weakness (generalized)     Problem List Patient Active Problem List   Diagnosis Date Noted  . Adhesive capsulitis 12/03/2017  . DM (diabetes mellitus) type I controlled with eye manifestation ([place])    [name], PT 02/08/18 7:32 PM Phone: [phone] Fax: [phone]  [name] 02/08/2018, 7:31 PM  [facility] [address], [place], [place], [address] Phone: [phone]   Fax:  [phone]  Name: ACHSAH MCQUADE MRN: [id number] Date of Birth: 20-Feb-1976

## 2018-02-09 ENCOUNTER — Encounter: Payer: Self-pay | Admitting: Physical Therapy

## 2018-02-09 ENCOUNTER — Ambulatory Visit: Payer: 59 | Admitting: Physical Therapy

## 2018-02-09 DIAGNOSIS — G8929 Other chronic pain: Secondary | ICD-10-CM | POA: Diagnosis not present

## 2018-02-09 DIAGNOSIS — M25512 Pain in left shoulder: Secondary | ICD-10-CM | POA: Diagnosis not present

## 2018-02-09 DIAGNOSIS — M25611 Stiffness of right shoulder, not elsewhere classified: Secondary | ICD-10-CM

## 2018-02-09 DIAGNOSIS — M25612 Stiffness of left shoulder, not elsewhere classified: Secondary | ICD-10-CM | POA: Diagnosis not present

## 2018-02-09 DIAGNOSIS — M6281 Muscle weakness (generalized): Secondary | ICD-10-CM

## 2018-02-09 DIAGNOSIS — M25511 Pain in right shoulder: Secondary | ICD-10-CM | POA: Diagnosis not present

## 2018-02-09 NOTE — Therapy (Signed)
[facility] [address] [place], [place], [address] Phone: [phone]   Fax:  [phone]  Physical Therapy Treatment  Patient Details  Name: Penny Andrade MRN: [id number] Date of Birth: 02-Nov-1976 Referring Provider: Dr. Teresa Coombs   Encounter Date: 02/09/2018  PT End of Session - 02/09/18 0858    Visit Number  11    Date for PT Re-Evaluation  02/25/18    Authorization Type  [employer]    PT Start Time  0851    PT Stop Time  0930    PT Time Calculation (min)  39 min    Activity Tolerance  Patient tolerated treatment well;No increased pain    Behavior During Therapy  WFL for tasks assessed/performed       Past Medical History:  Diagnosis Date  . Diabetes mellitus without complication ([place])    diagnosed at 42 years old 44. Dr. Delrae Rend  . UTI (urinary tract infection)     Past Surgical History:  Procedure Laterality Date  . none      There were no vitals filed for this visit.  Subjective Assessment - 02/09/18 0855    Subjective  Pt reports that things are going well. She feels overall 70% improved. Her rotation into IR/ER is really the only huge issue she has at this point. The Rt shoulder is improved following the needling and tape.     Currently in Pain?  No/denies                      [facility] Adult PT Treatment/Exercise - 02/09/18 0001      Shoulder Exercises: Supine   Flexion  Both;10 reps;Limitations    Flexion Limitations  x2 sets, horizontal abduction resistance with yellow TB resistance    Other Supine Exercises  D2 flexion with yellow TB x10 reps each UE       Shoulder Exercises: Standing   Other Standing Exercises  UE ranger L25, x20 reps each UE into flexion and scaption    Other Standing Exercises  yellow band tall table "side steps"  10x right/left; B bicep curls with yellow TB resistance at wrist, using 4# dumbbells 2x10 reps; serratus press and roll on high mat  table x15 reps       Shoulder Exercises: Stretch   Other Shoulder Stretches  Lt/Rt shoulder threading over foam roll x10 reps each    Other Shoulder Stretches  seated shoulder ER hold 10x5 sec (hands behind head); shoudler horizontal adduction AAROM at wall with end range stretch x5 sec, 10 reps each        Trigger Point Dry Needling - 02/08/18 1913    Consent Given?  Yes    Muscles Treated Upper Body  -- teres major and minor right/left    Upper Trapezius Response  Twitch reponse elicited;Palpable increased muscle length    Infraspinatus Response  Twitch response elicited;Palpable increased muscle length           PT Education - 02/09/18 0903    Education provided  Yes    Education Details  technique with therex     Person(s) Educated  Patient    Methods  Explanation;Verbal cues    Comprehension  Verbalized understanding;Returned demonstration       PT Short Term Goals - 02/08/18 1926      PT SHORT TERM GOAL #1   Title  The patient will demonstrate basic self care strategies for ROM and pain  control    Status  Achieved      PT SHORT TERM GOAL #2   Title  The patient will demonstrate improved right shoulder flexion ROM to 145 and left to 155 degrees needed for overhead reaching    Status  Achieved      PT SHORT TERM GOAL #3   Title  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        PT Long Term Goals - 02/09/18 0930      PT LONG TERM GOAL #1   Title  The patient will be independent in safe self progression of HEP and appropriate gym program    Time  8    Period  Weeks    Status  On-going      PT LONG TERM GOAL #2   Title  The patient will have improved right shoulder flexion to 150 and 160 on left for reaching overhead    Time  8    Period  Weeks    Status  New      PT LONG TERM GOAL #3   Title  Improved bilateral internal rotation to 45 degree or to mid lumbar region of the back  needed for grooming/dressing    Time  8    Period  Weeks    Status  New      PT LONG TERM GOAL #4   Title  The patient will have improved shoulder abduction strength to 4/5 and other glenohumeral/scapular muscles to at least 4+/5 for lifting and carrying objects    Time  8    Period  Weeks    Status  New      PT LONG TERM GOAL #5   Title  50% improvement in overall pain for sleeping, 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 Overall, she feels atleast 70% improved from the start of therapy, with her biggest issue rotating her shoulders internally/externally. Continued with shoulder ROM and strengthening exercises, therapist providing intermittent instruction for proper technique of new exercises. Pt does have limited horizontal adduction Rt>Lt which was addressed with a new stretch and added to pt's HEP. She verbalized good understanding of this.     Rehab Potential  Good    Clinical Impairments Affecting Rehab Potential  none    PT Frequency  2x / week    PT Duration  8 weeks    PT Treatment/Interventions  ADLs/Self Care Home Management;Cryotherapy;Electrical Stimulation;Ultrasound;Moist Heat;Iontophoresis 60m/ml Dexamethasone;Therapeutic activities;Therapeutic exercise;Patient/family education;Neuromuscular re-education;Manual techniques;Taping;Dry needling    PT Next Visit Plan  recheck ROM; kinesiotape as needed;  bil shoulder ROM for flexion and internal rotation; scapular and rotator cuff strengthening       Patient will  benefit from skilled therapeutic intervention  in order to improve the following deficits and impairments:  Pain, Decreased range of motion, Decreased strength, Hypomobility, Impaired UE functional use  Visit Diagnosis: Stiffness of right shoulder, not elsewhere classified  Stiffness of left shoulder, not elsewhere classified  Chronic right shoulder pain  Acute pain of left shoulder  Muscle weakness (generalized)     Problem List Patient Active Problem List   Diagnosis Date Noted  . Adhesive capsulitis 12/03/2017  . DM (diabetes mellitus) type I controlled with eye manifestation [facility])    9:31 AM,02/09/18 Sherol Dade PT, DPT [facility] [address], [place], [place], [address] Phone: [phone]   Fax:  [phone]  Name: Penny Andrade MRN: [id number] Date of Birth: 03/12/1976

## 2018-02-09 NOTE — Patient Instructions (Signed)
   Horizontal Adduction with Over Pressure  This is a progression of shoulder horizontal adduction (arm cross-body) repeated movement. Use wall to create more force/stretch into desired range of motion (directional preference). You can use the other arm to assist with the stretch. Hold 5 sec, repeat atleast 10 times each side.     [facility] [address] [place],  [address] Phone # [phone] Fax [phone]

## 2018-02-14 ENCOUNTER — Ambulatory Visit: Payer: 59 | Admitting: Sports Medicine

## 2018-02-16 ENCOUNTER — Ambulatory Visit: Payer: 59 | Admitting: Physical Therapy

## 2018-02-16 ENCOUNTER — Encounter: Payer: Self-pay | Admitting: Physical Therapy

## 2018-02-16 ENCOUNTER — Encounter: Payer: Self-pay | Admitting: Sports Medicine

## 2018-02-16 ENCOUNTER — Ambulatory Visit (INDEPENDENT_AMBULATORY_CARE_PROVIDER_SITE_OTHER): Payer: 59 | Admitting: Sports Medicine

## 2018-02-16 ENCOUNTER — Ambulatory Visit: Payer: 59 | Admitting: Sports Medicine

## 2018-02-16 VITALS — BP 128/78 | HR 101 | Ht 70.0 in | Wt 197.0 lb

## 2018-02-16 DIAGNOSIS — E10311 Type 1 diabetes mellitus with unspecified diabetic retinopathy with macular edema: Secondary | ICD-10-CM | POA: Diagnosis not present

## 2018-02-16 DIAGNOSIS — M25612 Stiffness of left shoulder, not elsewhere classified: Secondary | ICD-10-CM

## 2018-02-16 DIAGNOSIS — M25611 Stiffness of right shoulder, not elsewhere classified: Secondary | ICD-10-CM | POA: Diagnosis not present

## 2018-02-16 DIAGNOSIS — G8929 Other chronic pain: Secondary | ICD-10-CM

## 2018-02-16 DIAGNOSIS — M6281 Muscle weakness (generalized): Secondary | ICD-10-CM

## 2018-02-16 DIAGNOSIS — M25511 Pain in right shoulder: Secondary | ICD-10-CM

## 2018-02-16 DIAGNOSIS — M7502 Adhesive capsulitis of left shoulder: Secondary | ICD-10-CM | POA: Diagnosis not present

## 2018-02-16 DIAGNOSIS — M7501 Adhesive capsulitis of right shoulder: Secondary | ICD-10-CM

## 2018-02-16 DIAGNOSIS — M25512 Pain in left shoulder: Secondary | ICD-10-CM | POA: Diagnosis not present

## 2018-02-16 NOTE — Therapy (Signed)
[facility] [address] [place], [place], [address] Phone: [phone]   Fax:  [phone]  Physical Therapy Treatment  Patient Details  Name: Penny Andrade MRN: [id number] Date of Birth: 1975/12/29 Referring Provider: Dr. Teresa Coombs   Encounter Date: 02/16/2018  PT End of Session - 02/16/18 1626    Visit Number  12    Date for PT Re-Evaluation  02/25/18    Authorization Type  [employer]    PT Start Time  1610    PT Stop Time  1650    PT Time Calculation (min)  40 min    Activity Tolerance  Patient tolerated treatment well;No increased pain    Behavior During Therapy  WFL for tasks assessed/performed       Past Medical History:  Diagnosis Date  . Diabetes mellitus without complication ([place])    diagnosed at 42 years old 87. Dr. Delrae Rend  . UTI (urinary tract infection)     Past Surgical History:  Procedure Laterality Date  . none      There were no vitals filed for this visit.  Subjective Assessment - 02/16/18 1612    Subjective  Pt reports things continue to go well. She saw Dr. Paulla Fore who was pleased with her progress. She continues to work on her HEP.     Currently in Pain?  No/denies         [facility] PT Assessment - 02/16/18 0001      AROM   Right Shoulder Flexion  155 Degrees    Right Shoulder ABduction  160 Degrees    Left Shoulder Flexion  150 Degrees    Left Shoulder ABduction  155 Degrees                  [facility] Adult PT Treatment/Exercise - 02/16/18 0001      Shoulder Exercises: Supine   Other Supine Exercises  serratus press with green TB x15 reps; serratus press with eccentric resistance into horizontal abduction x15 reps       Shoulder Exercises: Sidelying   Flexion  Strengthening;Both;15 reps;Weights    Flexion Weight (lbs)  with 2# dumbbell     Other Sidelying Exercises  horizontal abduction x15 reps each with 2# dumbbells       Shoulder Exercises: Standing    External Rotation  Right;Left;20 reps;Theraband    Theraband Level (Shoulder External Rotation)  Level 2 (Red)    Internal Rotation  Right;20 reps;Theraband;Left    Theraband Level (Shoulder Internal Rotation)  Level 2 (Red)    Other Standing Exercises  shoulder W's with red TB 2x10 reps     Other Standing Exercises  yellow band "side stepping along mat table x3 trips Lt/Rt; plank hold with UE reach; serratus press and roll against wall x15 reps       Shoulder Exercises: ROM/Strengthening   UBE (Upper Arm Bike)  x2 min forward/backward L3 PT present to discuss progress and session             PT Education - 02/16/18 1651    Education provided  Yes    Education Details  technique with therex; decreasing to 1x/week until re-evaluation end of the month    Person(s) Educated  Patient    Methods  Explanation;Verbal cues;Tactile cues    Comprehension  Verbalized understanding;Returned demonstration       PT Short Term Goals - 02/08/18 1926      PT SHORT TERM GOAL #1  Title  The patient will demonstrate basic self care strategies for ROM and pain control    Status  Achieved      PT SHORT TERM GOAL #2   Title  The patient will demonstrate improved right shoulder flexion ROM to 145 and left to 155 degrees needed for overhead reaching    Status  Achieved      PT SHORT TERM GOAL #3   Title  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        PT Long Term Goals - 02/09/18 0930      PT LONG TERM GOAL #1   Title  The patient will be independent in safe self progression of HEP and appropriate gym program    Time  8    Period  Weeks    Status  On-going      PT LONG TERM GOAL #2   Title  The patient will have improved right shoulder flexion to 150 and 160 on left for reaching overhead    Time  8    Period  Weeks    Status  New      PT LONG TERM GOAL #3   Title  Improved bilateral internal rotation to  45 degree or to mid lumbar region of the back needed for grooming/dressing    Time  8    Period  Weeks    Status  New      PT LONG TERM GOAL #4   Title  The patient will have improved shoulder abduction strength to 4/5 and other glenohumeral/scapular muscles to at least 4+/5 for lifting and carrying objects    Time  8    Period  Weeks    Status  New      PT LONG TERM GOAL #5   Title  50% improvement in overall pain for sleeping, 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 She does continue to lack end ranges of shoulder elevation ROM, with slight discomfort along the top of the shoulder with this. Focused today on therex to improve rotator cuff and serratus anterior strength with pt able complete in more complex positions. Due to pt's consistent progress and compliance with her HEP, we discussed decreasing to 1x/week until her upcoming re-evaluation. She was agreeable with this and reported fatigue only end of session.     Rehab Potential  Good    Clinical Impairments Affecting Rehab Potential  none    PT Frequency  2x / week    PT Duration  8 weeks    PT Treatment/Interventions  ADLs/Self Care Home Management;Cryotherapy;Electrical Stimulation;Ultrasound;Moist Heat;Iontophoresis 81m/ml Dexamethasone;Therapeutic activities;Therapeutic exercise;Patient/family education;Neuromuscular re-education;Manual techniques;Taping;Dry needling    PT Next Visit Plan  kinesiotape as needed;  bil shoulder ROM for  flexion and internal rotation; scapular and rotator cuff strengthening     Consulted and Agree with Plan of Care  Patient       Patient will benefit from skilled therapeutic intervention in order to improve the following deficits and impairments:  Pain, Decreased range of motion, Decreased strength, Hypomobility, Impaired UE functional use  Visit Diagnosis: Stiffness of right shoulder, not elsewhere classified  Stiffness of left shoulder, not elsewhere classified  Chronic right shoulder pain  Acute pain of left shoulder  Muscle weakness (generalized)     Problem List Patient Active Problem List   Diagnosis Date Noted  . Adhesive capsulitis 12/03/2017  . 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 [place], [place], [address] Phone: [phone]   Fax:  [phone]  Name: Penny Andrade MRN: [id number] Date of Birth: [birth year]

## 2018-02-16 NOTE — Assessment & Plan Note (Signed)
Showing slow and steady improvement following 2 large-volume intra-articular injections with referral to physical therapy for modalities including dry needling which she reports being significantly effective.  I would like to see how she does with this over the next several months and will plan to check in with her in 10-12 weeks.  We discussed that she should be seeing steady improvement but as long she continues to show positive change no further intervention is indicated or needed at this time.  If any worsening symptoms can consider x-rays of her neck and/or further diagnostic evaluation with MRI of the shoulders but strength is otherwise doing well and likelihood of an underlying rotator cuff significant tear is low

## 2018-02-16 NOTE — Progress Notes (Signed)
Penny Andrade. Penny Andrade, [place] [facility] [phone]  Penny Andrade - 42 y.o. female MRN [id number]  Date of birth: [birth year]  Visit Date: 02/16/2018  PCP: Marin Olp, MD   Referred by: Marin Olp, MD   Scribe for today's visit: Wendy Poet, LAT, ATC     SUBJECTIVE:  Penny Andrade is here for Follow-up (B shoulder pain) .   Compared to the last office visit on 01/12/18, her previously described B shoulder pain symptoms are improving with improved  B shoulder ROM except for shoulder ER/IR which is still fairly limited. Current symptoms are mild & are nonradiating. She has been going to PT x 8 visits and is still taking her Amitryptaline.   ROS Reports night time disturbances. Denies fevers, chills, or night sweats. Denies unexplained weight loss. Denies personal history of cancer. Denies changes in bowel or bladder habits. Denies recent unreported falls. Denies new or worsening dyspnea or wheezing. Denies headaches or dizziness.  Denies numbness, tingling or weakness  In the extremities.  Denies dizziness or presyncopal episodes Denies lower extremity edema     HISTORY & PERTINENT PRIOR DATA:  Prior History reviewed and updated per electronic medical record.  Significant history, findings, studies and interim changes include:  reports that  has never smoked. she has never used smokeless tobacco. Recent Labs    04/23/17 1116  HGBA1C 8.1   No specialty comments available. Problem  Adhesive Capsulitis    OBJECTIVE:  VS:  HT:5' 10"  (177.8 cm)   WT:197 lb (89.4 kg)  BMI:28.27    BP:128/78  HR:(!) 101bpm  TEMP: ( )  RESP:98 %   PHYSICAL EXAM: Constitutional: WDWN, Non-toxic appearing. Psychiatric: Alert & appropriately interactive.  Not depressed or anxious appearing. Respiratory: No increased work of breathing.  Trachea Midline Eyes: Pupils are equal.  EOM intact without nystagmus.  No scleral  icterus  NEUROVASCULAR exam: No clubbing or cyanosis appreciated No significant venous stasis changes Capillary Refill: normal, less than 2 seconds   Bilateral shoulders well aligned with good improvements in range of motion with an almost 90 degree arc on the left and 70 degrees on the right.  Internal rotation external rotation strength is symmetric bilaterally and 5/5.  Persistent limitations with internal rotation to the point that she is only able to get to L4 on the left and her PSIS on the right   ASSESSMENT & PLAN:   1. Adhesive capsulitis of both shoulders   2. 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  I would like to see how she does with this over the next several months and will plan to check in with her in 10-12 weeks.  We discussed that she should be seeing steady improvement but as long she continues to show positive change no further intervention is indicated or needed at this time.  If any worsening symptoms can consider x-rays of her neck and/or further diagnostic evaluation with MRI of the shoulders but strength is otherwise doing well and likelihood of an underlying rotator cuff significant tear is low   ++++++++++++++++++++++++++++++++++++++++++++ Orders & Meds: No orders of the defined types were placed in this encounter.   No orders of  the defined types were placed in this encounter.   ++++++++++++++++++++++++++++++++++++++++++++ Follow-up: Return in about 10 weeks (around 04/27/2018).   Pertinent documentation may be included in additional procedure notes, imaging studies, problem based documentation and patient instructions. Please see  these sections of the encounter for additional information regarding this visit. CMA/ATC served as Education administrator during this visit. History, Physical, and Plan performed by medical provider. Documentation and orders reviewed and attested to.      Gerda Diss, [facility] Physician

## 2018-02-18 ENCOUNTER — Encounter: Payer: 59 | Admitting: Physical Therapy

## 2018-02-24 ENCOUNTER — Encounter: Payer: Self-pay | Admitting: Physical Therapy

## 2018-02-24 ENCOUNTER — Ambulatory Visit: Payer: 59 | Admitting: Physical Therapy

## 2018-02-24 DIAGNOSIS — M6281 Muscle weakness (generalized): Secondary | ICD-10-CM

## 2018-02-24 DIAGNOSIS — M25511 Pain in right shoulder: Secondary | ICD-10-CM | POA: Diagnosis not present

## 2018-02-24 DIAGNOSIS — M25512 Pain in left shoulder: Secondary | ICD-10-CM

## 2018-02-24 DIAGNOSIS — G8929 Other chronic pain: Secondary | ICD-10-CM | POA: Diagnosis not present

## 2018-02-24 DIAGNOSIS — M25611 Stiffness of right shoulder, not elsewhere classified: Secondary | ICD-10-CM | POA: Diagnosis not present

## 2018-02-24 DIAGNOSIS — M25612 Stiffness of left shoulder, not elsewhere classified: Secondary | ICD-10-CM

## 2018-02-24 NOTE — Therapy (Signed)
[facility] [address], [place], [place], [address] Phone: [phone]   Fax:  [phone]  Physical Therapy Treatment/Recertification  Patient Details  Name: Penny Andrade MRN: [id number] Date of Birth: 05-01-1976 Referring Provider: Dr. Teresa Coombs   Encounter Date: 02/24/2018  PT End of Session - 02/24/18 0953    Visit Number  13    Date for PT Re-Evaluation  04/21/18    Authorization Type  [employer]    PT Start Time  0850    PT Stop Time  0932    PT Time Calculation (min)  42 min    Activity Tolerance  Patient tolerated treatment well       Past Medical History:  Diagnosis Date  . Diabetes mellitus without complication ([place])    diagnosed at 42 years old 62. Dr. Delrae Rend  . UTI (urinary tract infection)     Past Surgical History:  Procedure Laterality Date  . none      There were no vitals filed for this visit.  Subjective Assessment - 02/24/18 0852    Subjective  Hurts to lie on the left side at night.  Motion is better.   Saw Dr. Paulla Fore and he said to decrease frequency to 1x/week.      Currently in Pain?  No/denies    Pain Score  0-No pain    Pain Orientation  Right;Left         [facility] PT Assessment - 02/24/18 0001      AROM   Right Shoulder Flexion  150 Degrees    Right Shoulder ABduction  148 Degrees    Right Shoulder Internal Rotation  -- L2    Right Shoulder External Rotation  66 Degrees    Left Shoulder Flexion  145 Degrees    Left Shoulder ABduction  153 Degrees    Left Shoulder Internal Rotation  -- T10    Left Shoulder External Rotation  80 Degrees      Strength   Right Shoulder Flexion  4/5    Right Shoulder Extension  4/5    Right Shoulder ABduction  4/5    Right Shoulder Internal Rotation  4/5    Right Shoulder External Rotation  4/5    Left Shoulder Flexion  4/5    Left Shoulder Extension  4/5    Left Shoulder ABduction  4/5    Left Shoulder Internal Rotation   4/5    Left Shoulder External Rotation  4/5                  [facility] Adult PT Treatment/Exercise - 02/24/18 0001      Shoulder Exercises: Supine   Other Supine Exercises  internal and external rotation stretch 1# 1 min right/left    Other Supine Exercises  flexion 1# 1 min hold bil       Shoulder Exercises: Sidelying   Other Sidelying Exercises  abduction stretch 1# 1 min hold    Other Sidelying Exercises  1# 12/6;00; 3/9:00 10x each      Shoulder Exercises: Standing   Other Standing Exercises  3 way wall slides       Shoulder Exercises: ROM/Strengthening   UBE (Upper Arm Bike)  x2 min forward/backward L3 PT present to discuss progress and session      Moist Heat Therapy   Number Minutes Moist Heat  10 Minutes    Moist Heat Location  Shoulder concurrent with ex  Manual Therapy   Joint Mobilization  right glenohumeral posterior and inferior mobs grade 3 at early, mid and endrange;  mob with movement; contract/relax to gain flexion 3x    Soft tissue mobilization  right deltoids, pectorals               PT Short Term Goals - 02/24/18 1004      PT SHORT TERM GOAL #1   Title  The patient will demonstrate basic self care strategies for ROM and pain control    Status  Achieved      PT SHORT TERM GOAL #2   Title  The patient will demonstrate improved right shoulder flexion ROM to 145 and left to 155 degrees needed for overhead reaching    Status  Achieved      PT SHORT TERM GOAL #3   Title  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        PT Long Term Goals - 02/24/18 1015      PT LONG TERM GOAL #1   Title  The patient will be independent in safe self progression of HEP and appropriate gym program    Time  8    Period  Weeks    Status  On-going    Target Date  04/21/18      PT LONG TERM GOAL #2   Title  The patient will have improved right shoulder flexion to 150 and 160  on left for reaching overhead    Time  8    Period  Weeks    Status  On-going      PT LONG TERM GOAL #3   Title  Improved bilateral internal rotation to 45 degree or to mid lumbar region of the back needed for grooming/dressing    Time  8    Period  Weeks    Status  On-going      PT LONG TERM GOAL #4   Title  The patient will have improved shoulder abduction strength to 4/5 and other glenohumeral/scapular muscles to at least 4+/5 for lifting and carrying objects    Time  8    Period  Weeks    Status  On-going      PT LONG TERM GOAL #5   Title  50% improvement in overall pain for sleeping, 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  Her glenohumeral and scapular strength are grossly 4/5 bilaterally.   Overall her pain is much improved but continues to have pain in left shoulder at night time.   Decreased pectoral and subscapular muscle lengths and she may benefit from further dry needling for further muscular release.  She had significant improvements in ROM following previous sessions which included DN.  She is concerned about the right shoulder injection she received might be "wearing off" since it  has been 3 months.  She would be a good candidate for iontophoresis on right.  Will decreased frequency of treatment to 1x/week as the "thawing" stage of adhesive capsulitis can be lengthy.   Extensive time in session today discussing the low load long duration stretch principle and recommendation of 2 minutes every 2 hours of ROM.      Rehab Potential  Good    Clinical Impairments  Affecting Rehab Potential  none    PT Frequency  1x / week    PT Duration  8 weeks    PT Treatment/Interventions  ADLs/Self Care Home Management;Cryotherapy;Electrical Stimulation;Ultrasound;Moist Heat;Iontophoresis 39m/ml Dexamethasone;Therapeutic activities;Therapeutic exercise;Patient/family education;Neuromuscular re-education;Manual techniques;Taping;Dry needling    PT Next Visit Plan  [place]check [place]  initiate right shoulder ionto (left had recent injection);  DN;  bil shoulder ROM;  strengthening;  1x/week       Patient will benefit from skilled therapeutic intervention in order to improve the following deficits and impairments:  Pain, Decreased range of motion, Decreased strength, Hypomobility, Impaired UE functional use  Visit Diagnosis: Stiffness of right shoulder, not elsewhere classified - Plan: PT plan of care cert/re-cert  Stiffness of left shoulder, not elsewhere classified - Plan: PT plan of care cert/re-cert  Chronic right shoulder pain - Plan: PT plan of care cert/re-cert  Acute pain of left shoulder - Plan: PT plan of care cert/re-cert  Muscle weakness (generalized) - Plan: PT plan of care cert/re-cert     Problem List Patient Active Problem List   Diagnosis Date Noted  . Adhesive capsulitis 12/03/2017  . 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 [place] [address]Phone: [phone]  Fax:  [phone] Name: Penny SILVESTERMRN: [id number] of Birth: 402/15/1977

## 2018-03-10 ENCOUNTER — Ambulatory Visit: Payer: 59 | Attending: General Practice | Admitting: Physical Therapy

## 2018-03-10 DIAGNOSIS — M25511 Pain in right shoulder: Secondary | ICD-10-CM | POA: Diagnosis not present

## 2018-03-10 DIAGNOSIS — M25612 Stiffness of left shoulder, not elsewhere classified: Secondary | ICD-10-CM | POA: Diagnosis not present

## 2018-03-10 DIAGNOSIS — M25512 Pain in left shoulder: Secondary | ICD-10-CM | POA: Diagnosis not present

## 2018-03-10 DIAGNOSIS — G8929 Other chronic pain: Secondary | ICD-10-CM

## 2018-03-10 DIAGNOSIS — M25611 Stiffness of right shoulder, not elsewhere classified: Secondary | ICD-10-CM

## 2018-03-10 DIAGNOSIS — M6281 Muscle weakness (generalized): Secondary | ICD-10-CM

## 2018-03-10 NOTE — Patient Instructions (Signed)

## 2018-03-10 NOTE — Therapy (Signed)
[facility] [address] [place], [place], [address] Phone: [phone]   Fax:  [phone]  Physical Therapy Treatment  Patient Details  Name: Penny Andrade MRN: [id number] Date of Birth: 20-Feb-1976 Referring Provider: Dr. Teresa Coombs   Encounter Date: 03/10/2018  PT End of Session - 03/10/18 1752    Visit Number  14    Date for PT Re-Evaluation  04/21/18    Authorization Type  cone [employer]    PT Start Time  0730    PT Stop Time  0800 DN;  30 min appt    PT Time Calculation (min)  30 min    Activity Tolerance  Patient tolerated treatment well       Past Medical History:  Diagnosis Date  . Diabetes mellitus without complication ([place])    diagnosed at 42 years old 33. Dr. Delrae Rend  . UTI (urinary tract infection)     Past Surgical History:  Procedure Laterality Date  . none      There were no vitals filed for this visit.                   [facility] Adult PT Treatment/Exercise - 03/10/18 0001      Shoulder Exercises: Standing   Extension  Strengthening;Both;Theraband    Row  Strengthening;Both;Theraband    Theraband Level (Shoulder Row)  Level 3 (Green)    Other Standing Exercises  UE ranger L25, x20 reps each UE into flexion and scaption      Iontophoresis   Type of Iontophoresis  Dexamethasone    Location  right shoulder    Dose  4 mg/ml     Time  4-6 hour patch      Manual Therapy   Joint Mobilization  left glenohumeral distraction, inferior and posterior grade 3 mobs and mobs with movement     Soft tissue mobilization  right infraspinatus, teres     Kinesiotex  Facilitate Muscle      Kinesiotix   Facilitate Muscle   3 strips:posterior deltoid, anterior deltoid and horizontal strip across middle deltoid left       Trigger Point Dry Needling - 03/10/18 1751    Consent Given?  Yes    Muscles Treated Upper Body  -- left teres    Upper Trapezius Response  Twitch reponse  elicited;Palpable increased muscle length    Infraspinatus Response  Palpable increased muscle length    Subscapularis Response  Palpable increased muscle length       Left only    PT Education - 03/10/18 1752    Education provided  Yes    Education Details  ionto info    Person(s) Educated  Patient    Methods  Explanation;Demonstration;Handout    Comprehension  Returned demonstration;Verbalized understanding       PT Short Term Goals - 02/24/18 1004      PT SHORT TERM GOAL #1   Title  The patient will demonstrate basic self care strategies for ROM and pain control    Status  Achieved      PT SHORT TERM GOAL #2   Title  The patient will demonstrate improved right shoulder flexion ROM to 145 and left to 155 degrees needed for overhead reaching    Status  Achieved      PT SHORT TERM GOAL #3   Title  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  Status  Achieved        PT Long Term Goals - 02/24/18 1015      PT LONG TERM GOAL #1   Title  The patient will be independent in safe self progression of HEP and appropriate gym program    Time  8    Period  Weeks    Status  On-going    Target Date  04/21/18      PT LONG TERM GOAL #2   Title  The patient will have improved right shoulder flexion to 150 and 160 on left for reaching overhead    Time  8    Period  Weeks    Status  On-going      PT LONG TERM GOAL #3   Title  Improved bilateral internal rotation to 45 degree or to mid lumbar region of the back needed for grooming/dressing    Time  8    Period  Weeks    Status  On-going      PT LONG TERM GOAL #4   Title  The patient will have improved shoulder abduction strength to 4/5 and other glenohumeral/scapular muscles to at least 4+/5 for lifting and carrying objects    Time  8    Period  Weeks    Status  On-going      PT LONG TERM GOAL #5   Title  50% improvement in overall pain for sleeping, home ADLS and  work duties as a Engineer, mining    Status  Achieved      PT [place] #6   Title  FOTO functional outcome score improved form 36% limitation to 28% indicating improved function with less pain    Time  8    Period  Weeks    Status  On-going            Plan - 03/10/18 1752    Clinical Impression Statement  The patient reports a recent flare up of left shoulder pain.  She reports she has not been as compliant with her HEP as she would like secondary to working long hours.  Encouraged focus on left peri scapular muscle strengthening.  Initiated right shoulder ionto (not done on left secondary to recent steroid injection).  Decompressive taping applied on left for pain relief.      Rehab Potential  Good    Clinical Impairments Affecting Rehab Potential  none    PT Frequency  1x / week    PT Duration  8 weeks    PT Treatment/Interventions  ADLs/Self Care Home Management;Cryotherapy;Electrical Stimulation;Ultrasound;Moist Heat;Iontophoresis 76m/ml Dexamethasone;Therapeutic activities;Therapeutic exercise;Patient/family education;Neuromuscular re-education;Manual techniques;Taping;Dry needling    PT Next Visit Plan  recheck FOTO;  #2 right shoulder ionto (left had recent injection);  DN;  bil shoulder ROM; scapular strengthening;  1x/week; recheck ROM       Patient will benefit from skilled therapeutic intervention in order to improve the following deficits and impairments:  Pain, Decreased range of motion, Decreased strength, Hypomobility, Impaired UE functional use  Visit Diagnosis: Stiffness of right shoulder, not elsewhere classified  Stiffness of left shoulder, not elsewhere classified  Chronic right shoulder pain  Acute pain of left shoulder  Muscle weakness (generalized)     Problem List Patient Active Problem List   Diagnosis Date Noted  . Adhesive capsulitis 12/03/2017  . DM (diabetes mellitus) type I controlled with eye manifestation ([place]    SRuben Im  PT 03/10/18 5:58 PM Phone: [phone]: [phone] SRuben Im  C 03/10/2018, 5:58 PM  [facility] [address] [place], [place], [address] Phone: [phone]   Fax:  [phone]  Name: LOVINIA SNARE MRN: [id number] Date of Birth: July 22, 1976

## 2018-03-30 ENCOUNTER — Telehealth: Payer: 59 | Admitting: Nurse Practitioner

## 2018-03-30 DIAGNOSIS — R05 Cough: Secondary | ICD-10-CM

## 2018-03-30 DIAGNOSIS — R059 Cough, unspecified: Secondary | ICD-10-CM

## 2018-03-30 MED ORDER — BENZONATATE 100 MG PO CAPS: 100.0000 mg | ORAL_CAPSULE | Freq: Three times a day (TID) | ORAL | 0 refills | Status: DC | PRN

## 2018-03-30 MED ORDER — BENZONATATE 100 MG PO CAPS: 100.0000 mg | ORAL_CAPSULE | Freq: Three times a day (TID) | ORAL | 1 refills | Status: DC | PRN

## 2018-03-30 NOTE — Addendum Note (Signed)
Addended by: Chevis Pretty on: 03/30/2018 04:12 PM   Modules accepted: Orders

## 2018-03-30 NOTE — Progress Notes (Signed)

## 2018-03-30 NOTE — Addendum Note (Signed)
Addended by: Evelina Dun A on: 03/30/2018 05:34 PM   Modules accepted: Orders

## 2018-04-01 ENCOUNTER — Ambulatory Visit: Payer: 59 | Admitting: Physical Therapy

## 2018-04-13 DIAGNOSIS — E1065 Type 1 diabetes mellitus with hyperglycemia: Secondary | ICD-10-CM | POA: Diagnosis not present

## 2018-04-14 ENCOUNTER — Ambulatory Visit: Payer: 59 | Attending: General Practice | Admitting: Physical Therapy

## 2018-04-14 ENCOUNTER — Encounter: Payer: Self-pay | Admitting: Physical Therapy

## 2018-04-14 DIAGNOSIS — M25512 Pain in left shoulder: Secondary | ICD-10-CM | POA: Diagnosis not present

## 2018-04-14 DIAGNOSIS — M25612 Stiffness of left shoulder, not elsewhere classified: Secondary | ICD-10-CM | POA: Insufficient documentation

## 2018-04-14 DIAGNOSIS — G8929 Other chronic pain: Secondary | ICD-10-CM | POA: Insufficient documentation

## 2018-04-14 DIAGNOSIS — M25611 Stiffness of right shoulder, not elsewhere classified: Secondary | ICD-10-CM | POA: Insufficient documentation

## 2018-04-14 DIAGNOSIS — M25511 Pain in right shoulder: Secondary | ICD-10-CM | POA: Insufficient documentation

## 2018-04-14 DIAGNOSIS — M6281 Muscle weakness (generalized): Secondary | ICD-10-CM | POA: Diagnosis not present

## 2018-04-14 NOTE — Therapy (Signed)
[facility] [address] [place], [place], [address] Phone: [phone]   Fax:  [phone]  Physical Therapy Treatment/Recertification  Patient Details  Name: Penny Andrade MRN: [id number] Date of Birth: 07-Mar-1976 Referring Provider: Dr. Teresa Coombs   Encounter Date: 04/14/2018  PT End of Session - 04/14/18 1354    Visit Number  15    Date for PT Re-Evaluation  06/09/18    Authorization Type  cone [employer]    PT Start Time  0810    PT Stop Time  0843    PT Time Calculation (min)  33 min    Activity Tolerance  Patient tolerated treatment well       Past Medical History:  Diagnosis Date  . Diabetes mellitus without complication ([place])    diagnosed at 42 years old 81. Dr. Delrae Rend  . UTI (urinary tract infection)     Past Surgical History:  Procedure Laterality Date  . none      There were no vitals filed for this visit.  Subjective Assessment - 04/14/18 0810    Subjective  Left shoulder hurts at night time.  It feels like it moves out of place.   I think I have regressed some with ROM.  Sees Dr. Paulla Fore in May    Currently in Pain?  Yes    Pain Score  2  not right    Pain Location  Shoulder    Pain Orientation  Left    Pain Type  Chronic pain    Aggravating Factors   lying on my back         [facility] PT Assessment - 04/14/18 0001      Observation/Other Assessments   Focus on Therapeutic Outcomes (FOTO)   37% limitation      AROM   Right Shoulder Flexion  130 Degrees    Right Shoulder ABduction  128 Degrees    Right Shoulder Internal Rotation  -- L5    Right Shoulder External Rotation  71 Degrees    Left Shoulder Flexion  121 Degrees    Left Shoulder ABduction  130 Degrees    Left Shoulder Internal Rotation  -- L5    Left Shoulder External Rotation  69 Degrees      Strength   Right Shoulder Flexion  4/5    Right Shoulder Extension  4/5    Right Shoulder ABduction  4/5    Right Shoulder  Internal Rotation  4/5    Right Shoulder External Rotation  4/5    Left Shoulder Flexion  4/5    Left Shoulder Extension  4/5    Left Shoulder ABduction  4/5    Left Shoulder Internal Rotation  4/5    Left Shoulder External Rotation  4/5                   [facility] Adult PT Treatment/Exercise - 04/14/18 0001      Shoulder Exercises: Supine   Flexion Limitations  5x right/left      Shoulder Exercises: Standing   Other Standing Exercises  UE Ranger on wall L 17 15 x right/left      Iontophoresis   Type of Iontophoresis  Dexamethasone    Location  left shoulder    Dose  4 mg/ml     Time  4-6 hour patch      Manual Therapy   Soft tissue mobilization  left upper trap, infraspinatus, subscapularis  Trigger Point Dry Needling - 04/14/18 1352    Consent Given?  Yes    Upper Trapezius Response  Twitch reponse elicited;Palpable increased muscle length    Infraspinatus Response  Twitch response elicited;Palpable increased muscle length    Subscapularis Response  Palpable increased muscle length      Left shoulder        PT Short Term Goals - 04/14/18 1829      PT SHORT TERM GOAL #1   Title  The patient will demonstrate basic self care strategies for ROM and pain control    Status  Achieved      PT SHORT TERM GOAL #2   Title  The patient will demonstrate improved right shoulder flexion ROM to 145 and left to 155 degrees needed for overhead reaching    Baseline  previously met    Status  Achieved      PT SHORT TERM GOAL #3   Title  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        PT Long Term Goals - 04/14/18 1829      PT LONG TERM GOAL #1   Title  The patient will be independent in safe self progression of HEP and appropriate gym program    Time  8    Period  Weeks    Status  On-going    Target Date  06/09/18      PT LONG TERM GOAL #2   Title  The patient will have  improved right shoulder flexion to 150 and 160 on left for reaching overhead    Time  8    Period  Weeks    Status  On-going      PT LONG TERM GOAL #3   Title  Improved bilateral internal rotation to 45 degree or to mid lumbar region of the back needed for grooming/dressing    Time  8    Period  Weeks    Status  On-going      PT LONG TERM GOAL #4   Title  The patient will have improved shoulder abduction strength to 4/5 and other glenohumeral/scapular muscles to at least 4+/5 for lifting and carrying objects    Time  8    Period  Weeks    Status  On-going      PT LONG TERM GOAL #5   Title  50% improvement in overall pain for sleeping, home ADLS and work duties as a nurse practioner    Time  8    Period  Weeks    Status  Revised      PT LONG TERM GOAL #6   Title  FOTO functional outcome score improved form 36% limitation to 28% indicating improved function with less pain    Time  8    Period  Weeks    Status  On-going            Plan - 04/14/18 1822    Clinical Impression Statement  The patient returns after > 1 month since last PT visit.   She works multiple jobs and her schedule has been hectic.  She states she has not been compliant with her HEP.  As a result the patient demonstrates a 20 degrees loss of motion in all planes bilaterally from last ROM measurements taken 03/10/18.  She reports her left shoulder pain has been increased as well   especially at night.  The patient had made steady gains previously with PT but she has regressed in the past month.  She would benefit from PT to address the significant stiffness and pain in both shoulders.      Rehab Potential  Good    Clinical Impairments Affecting Rehab Potential  none    PT Frequency  2x / week    PT Duration  8 weeks    PT Treatment/Interventions  ADLs/Self Care Home Management;Cryotherapy;Electrical Stimulation;Ultrasound;Moist Heat;Iontophoresis 32m/ml Dexamethasone;Therapeutic activities;Therapeutic  exercise;Patient/family education;Neuromuscular re-education;Manual techniques;Taping;Dry needling    PT Next Visit Plan  assess response to ionto left shoulder and DN;  manual therapy.  ROM ex's;  modalities to improve soft tissue extensibility        Patient will benefit from skilled therapeutic intervention in order to improve the following deficits and impairments:  Pain, Decreased range of motion, Decreased strength, Hypomobility, Impaired UE functional use  Visit Diagnosis: Stiffness of right shoulder, not elsewhere classified - Plan: PT plan of care cert/re-cert  Stiffness of left shoulder, not elsewhere classified - Plan: PT plan of care cert/re-cert  Chronic right shoulder pain - Plan: PT plan of care cert/re-cert  Acute pain of left shoulder - Plan: PT plan of care cert/re-cert  Muscle weakness (generalized) - Plan: PT plan of care cert/re-cert     Problem List Patient Active Problem List   Diagnosis Date Noted  . Adhesive capsulitis 12/03/2017  . DM (diabetes mellitus) type I controlled with eye manifestation ([facility]    SRuben Im PT 04/14/18 6:34 PM Phone: [phone]: [phone] SAlvera Singh4/18/2019, 6:33 PM  [facility] [address] [place] [address]Phone: [phone]  Fax:  [phone] Name: Penny MORRISSETTEMRN: [id number] of Birth: [birth year]

## 2018-04-27 ENCOUNTER — Ambulatory Visit: Payer: 59 | Admitting: Sports Medicine

## 2018-04-28 ENCOUNTER — Ambulatory Visit: Payer: 59 | Attending: General Practice | Admitting: Physical Therapy

## 2018-04-28 ENCOUNTER — Encounter: Payer: Self-pay | Admitting: Physical Therapy

## 2018-04-28 DIAGNOSIS — G8929 Other chronic pain: Secondary | ICD-10-CM | POA: Insufficient documentation

## 2018-04-28 DIAGNOSIS — M25511 Pain in right shoulder: Secondary | ICD-10-CM | POA: Insufficient documentation

## 2018-04-28 DIAGNOSIS — M25611 Stiffness of right shoulder, not elsewhere classified: Secondary | ICD-10-CM | POA: Diagnosis not present

## 2018-04-28 DIAGNOSIS — M25512 Pain in left shoulder: Secondary | ICD-10-CM | POA: Diagnosis not present

## 2018-04-28 DIAGNOSIS — M25612 Stiffness of left shoulder, not elsewhere classified: Secondary | ICD-10-CM | POA: Diagnosis not present

## 2018-04-28 DIAGNOSIS — M6281 Muscle weakness (generalized): Secondary | ICD-10-CM | POA: Insufficient documentation

## 2018-04-28 NOTE — Therapy (Signed)
[facility] [address] [place], [place], [address] Phone: [phone]   Fax:  [phone]  Physical Therapy Treatment  Patient Details  Name: Penny Andrade MRN: [id number] Date of Birth: April 26, 1976 Referring Provider: Dr. Teresa Coombs   Encounter Date: 04/28/2018  PT End of Session - 04/28/18 1042    Visit Number  16    Date for PT Re-Evaluation  06/09/18    Authorization Type  [employer]    PT Start Time  0844    PT Stop Time  0930    PT Time Calculation (min)  46 min    Activity Tolerance  Patient tolerated treatment well       Past Medical History:  Diagnosis Date  . Diabetes mellitus without complication ([place])    diagnosed at 42 years old 74. Dr. Delrae Rend  . UTI (urinary tract infection)     Past Surgical History:  Procedure Laterality Date  . none      There were no vitals filed for this visit.  Subjective Assessment - 04/28/18 0843    Subjective  It took me 15 min to put on a sports bra b/c of my left shoulder.    (Pended)     Patient Stated Goals  be able to increase ROM;  improve pain;  sleep at night  (Pended)     Currently in Pain?  No/denies  (Pended)     Pain Score  0-No pain  (Pended)     Pain Location  Shoulder  (Pended)     Pain Orientation  Left  (Pended)                        [facility] Adult PT Treatment/Exercise - 04/28/18 0001      Shoulder Exercises: Supine   Flexion Limitations  5x right/left UE Ranger    ABduction  AAROM;Right;Left;5 reps      Shoulder Exercises: Sidelying   ABduction  Left;5 reps      Shoulder Exercises: Standing   Other Standing Exercises  UE Ranger on wall L 10 15 x right/left      Shoulder Exercises: ROM/Strengthening   Other ROM/Strengthening Exercises  review of HEP with emphasis on stretching every 2 hours      Moist Heat Therapy   Number Minutes Moist Heat  10 Minutes    Moist Heat Location  Shoulder bil concurrent with  manual therapy      Iontophoresis   Type of Iontophoresis  Dexamethasone    Location  bil shoulders    Dose  4 mg/ml     Time  4-6 hour patch      Manual Therapy   Manual therapy comments  left manual pectoral stretch     Joint Mobilization  bil glenohumeral posterior and inferior mobs grade 3 at early, mid and endrange;  mob with movement; contract/relax to gain flexion 3x    Soft tissue mobilization  left upper trap, infraspinatus, subscapularis    Scapular Mobilization  scapular medial and lateral glides       Trigger Point Dry Needling - 04/28/18 1041    Consent Given?  Yes    Muscles Treated Upper Body  Pectoralis major;Pectoralis minor    Upper Trapezius Response  Twitch reponse elicited;Palpable increased muscle length    Pectoralis Major Response  Palpable increased muscle length    Pectoralis Minor Response  Palpable increased muscle length    Infraspinatus Response  Palpable increased muscle length    Subscapularis Response  Palpable increased muscle length        Left only for DN     PT Short Term Goals - 04/14/18 1829      PT SHORT TERM GOAL #1   Title  The patient will demonstrate basic self care strategies for ROM and pain control    Status  Achieved      PT SHORT TERM GOAL #2   Title  The patient will demonstrate improved right shoulder flexion ROM to 145 and left to 155 degrees needed for overhead reaching    Baseline  previously met    Status  Achieved      PT SHORT TERM GOAL #3   Title  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        PT Long Term Goals - 04/14/18 1829      PT LONG TERM GOAL #1   Title  The patient will be independent in safe self progression of HEP and appropriate gym program    Time  8    Period  Weeks    Status  On-going    Target Date  06/09/18      PT LONG TERM GOAL #2   Title  The patient will have improved right shoulder flexion to 150 and  160 on left for reaching overhead    Time  8    Period  Weeks    Status  On-going      PT LONG TERM GOAL #3   Title  Improved bilateral internal rotation to 45 degree or to mid lumbar region of the back needed for grooming/dressing    Time  8    Period  Weeks    Status  On-going      PT LONG TERM GOAL #4   Title  The patient will have improved shoulder abduction strength to 4/5 and other glenohumeral/scapular muscles to at least 4+/5 for lifting and carrying objects    Time  8    Period  Weeks    Status  On-going      PT LONG TERM GOAL #5   Title  50% improvement in overall pain for sleeping, 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  She has been limited in her attendance secondary to her work schedule.  Encouraged more frequent ROM 3-5 min every 2 hours.   Improved soft tissue lengths of left pectorals, upper traps and peri-scapular muscles following manual therapy, DN and moist heat.      Rehab Potential  Good    Clinical Impairments Affecting Rehab Potential  none    PT Frequency  2x / week    PT Duration  8 weeks    PT  Treatment/Interventions  ADLs/Self Care Home Management;Cryotherapy;Electrical Stimulation;Ultrasound;Moist Heat;Iontophoresis 16m/ml Dexamethasone;Therapeutic activities;Therapeutic exercise;Patient/family education;Neuromuscular re-education;Manual techniques;Taping;Dry needling    PT Next Visit Plan  recheck ROM;   ionto bil shoulders;  DN;  manual therapy.  ROM ex's;  modalities to improve soft tissue extensibility        Patient will  benefit from skilled therapeutic intervention in order to improve the following deficits and impairments:  Pain, Decreased range of motion, Decreased strength, Hypomobility, Impaired UE functional use  Visit Diagnosis: Stiffness of right shoulder, not elsewhere classified  Stiffness of left shoulder, not elsewhere classified  Chronic right shoulder pain  Acute pain of left shoulder  Muscle weakness (generalized)     Problem List Patient Active Problem List   Diagnosis Date Noted  . Adhesive capsulitis 12/03/2017  . DM (diabetes mellitus) type I controlled with eye manifestation ([facility]    SRuben Im PT 04/28/18 5:33 PM Phone: [phone]: [phone] SAlvera Singh5/01/2018, 5:32 PM  [facility] [address] [place] [address]Phone: [phone]  Fax:  [phone] Name: CTHAMAR HOLIKMRN: [id number] of Birth: 4July 17, 1977

## 2018-04-29 ENCOUNTER — Ambulatory Visit: Payer: 59 | Admitting: Physical Therapy

## 2018-05-03 ENCOUNTER — Ambulatory Visit: Payer: 59 | Admitting: Sports Medicine

## 2018-05-03 ENCOUNTER — Encounter: Payer: Self-pay | Admitting: Physical Therapy

## 2018-05-03 ENCOUNTER — Ambulatory Visit: Payer: 59 | Admitting: Physical Therapy

## 2018-05-03 DIAGNOSIS — M25611 Stiffness of right shoulder, not elsewhere classified: Secondary | ICD-10-CM

## 2018-05-03 DIAGNOSIS — M25511 Pain in right shoulder: Secondary | ICD-10-CM | POA: Diagnosis not present

## 2018-05-03 DIAGNOSIS — E1065 Type 1 diabetes mellitus with hyperglycemia: Secondary | ICD-10-CM | POA: Diagnosis not present

## 2018-05-03 DIAGNOSIS — G8929 Other chronic pain: Secondary | ICD-10-CM

## 2018-05-03 DIAGNOSIS — M25612 Stiffness of left shoulder, not elsewhere classified: Secondary | ICD-10-CM

## 2018-05-03 DIAGNOSIS — M25512 Pain in left shoulder: Secondary | ICD-10-CM

## 2018-05-03 DIAGNOSIS — E103213 Type 1 diabetes mellitus with mild nonproliferative diabetic retinopathy with macular edema, bilateral: Secondary | ICD-10-CM | POA: Diagnosis not present

## 2018-05-03 DIAGNOSIS — M6281 Muscle weakness (generalized): Secondary | ICD-10-CM

## 2018-05-03 DIAGNOSIS — Z794 Long term (current) use of insulin: Secondary | ICD-10-CM | POA: Diagnosis not present

## 2018-05-03 LAB — HEMOGLOBIN A1C: Hemoglobin A1C: 10

## 2018-05-03 LAB — MICROALBUMIN, URINE: Microalb, Ur: 0.8

## 2018-05-03 NOTE — Therapy (Signed)
[facility] [address] [place], [place], [address] Phone: [phone]   Fax:  [phone]  Physical Therapy Treatment  Patient Details  Name: Penny Andrade MRN: [id number] Date of Birth: [birth year] Referring Provider: Dr. Teresa Coombs   Encounter Date: 05/03/2018  PT End of Session - 05/03/18 0938    Visit Number  17    Date for PT Re-Evaluation  06/09/18    Authorization Type  [employer]    PT Start Time  0845    PT Stop Time  0931    PT Time Calculation (min)  46 min    Activity Tolerance  Patient tolerated treatment well       Past Medical History:  Diagnosis Date  . Diabetes mellitus without complication ([place])    diagnosed at 42 years old 51. Dr. Delrae Rend  . UTI (urinary tract infection)     Past Surgical History:  Procedure Laterality Date  . none      There were no vitals filed for this visit.  Subjective Assessment - 05/03/18 0845    Subjective  Long day yesterday.  Shoulders feeling better.  Sleeping better.      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  No/denies    Pain Score  0-No pain    Pain Location  Shoulder    Pain Orientation  Left;Right    Pain Type  Chronic pain    Aggravating Factors   certain movements         [facility] PT Assessment - 05/03/18 0001      AROM   Right Shoulder Flexion  138 Degrees    Right Shoulder ABduction  137 Degrees    Right Shoulder Internal Rotation  -- L3    Right Shoulder External Rotation  80 Degrees    Left Shoulder Flexion  134 Degrees    Left Shoulder ABduction  141 Degrees    Left Shoulder Internal Rotation  -- L3    Left Shoulder External Rotation  60 Degrees                   [facility] Adult PT Treatment/Exercise - 05/03/18 0001      Shoulder Exercises: Supine   Flexion  Left;5 reps    Flexion Limitations  static stretch with 2# weight      Shoulder Exercises: Sidelying   Other Sidelying Exercises  open books 10x bil    Other Sidelying Exercises  bil shoulder adduction stretch 8x       Shoulder Exercises: Standing   Other Standing Exercises  UE Ranger on wall L 15 15 x right/left    Other Standing Exercises  UE Ranger for internal rotation 10x right/left      Shoulder Exercises: ROM/Strengthening   Other ROM/Strengthening Exercises  review of HEP with emphasis on stretching every 2 hours    Other ROM/Strengthening Exercises  sidelying sleeper stretch 5x 20 sec holds left      Moist Heat Therapy   Number Minutes Moist Heat  10 Minutes    Moist Heat Location  Shoulder bil concurrent with manual therapy      Iontophoresis   Type of Iontophoresis  Dexamethasone    Location  bil shoulders    Dose  4 mg/ml     Time  4-6 hour patch      Manual Therapy   Manual therapy comments  bil manual  pectoral stretch     Joint Mobilization  bil glenohumeral posterior and inferior mobs grade 3 at early, mid and endrange;  mob with movement; contract/relax to gain flexion 3x    Soft tissue mobilization  --    Scapular Mobilization  scapular medial and lateral glides               PT Short Term Goals - 04/14/18 1829      PT SHORT TERM GOAL #1   Title  The patient will demonstrate basic self care strategies for ROM and pain control    Status  Achieved      PT SHORT TERM GOAL #2   Title  The patient will demonstrate improved right shoulder flexion ROM to 145 and left to 155 degrees needed for overhead reaching    Baseline  previously met    Status  Achieved      PT SHORT TERM GOAL #3   Title  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        PT Long Term Goals - 04/14/18 1829      PT LONG TERM GOAL #1   Title  The patient will be independent in safe self progression of HEP and appropriate gym program    Time  8    Period  Weeks    Status  On-going    Target Date  06/09/18       PT LONG TERM GOAL #2   Title  The patient will have improved right shoulder flexion to 150 and 160 on left for reaching overhead    Time  8    Period  Weeks    Status  On-going      PT LONG TERM GOAL #3   Title  Improved bilateral internal rotation to 45 degree or to mid lumbar region of the back needed for grooming/dressing    Time  8    Period  Weeks    Status  On-going      PT LONG TERM GOAL #4   Title  The patient will have improved shoulder abduction strength to 4/5 and other glenohumeral/scapular muscles to at least 4+/5 for lifting and carrying objects    Time  8    Period  Weeks    Status  On-going      PT LONG TERM GOAL #5   Title  50% improvement in overall pain for sleeping, 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  She had a decline during the month she missed PT.    Reports good pain relief with iontophoresis and is sleeping better and functioning better.  Painful with endrange stretching.  Therapist closely monitoring response with all interventions.  Rehab Potential  Good    Clinical Impairments Affecting Rehab Potential  none    PT Frequency  2x / week    PT Duration  8 weeks    PT Treatment/Interventions  ADLs/Self Care Home Management;Cryotherapy;Electrical Stimulation;Ultrasound;Moist Heat;Iontophoresis 71m/ml Dexamethasone;Therapeutic activities;Therapeutic exercise;Patient/family education;Neuromuscular re-education;Manual techniques;Taping;Dry needling    PT Next Visit  Plan  recheck ROM for MD appt 5/16;    ionto bil shoulders;  DN;  manual therapy.  ROM ex's;  modalities to improve soft tissue extensibility        Patient will benefit from skilled therapeutic intervention in order to improve the following deficits and impairments:  Pain, Decreased range of motion, Decreased strength, Hypomobility, Impaired UE functional use  Visit Diagnosis: Stiffness of right shoulder, not elsewhere classified  Stiffness of left shoulder, not elsewhere classified  Chronic right shoulder pain  Acute pain of left shoulder  Muscle weakness (generalized)     Problem List Patient Active Problem List   Diagnosis Date Noted  . Adhesive capsulitis 12/03/2017  . DM (diabetes mellitus) type I controlled with eye manifestation ([place]    SRuben Im PT 05/03/18 1:54 PM Phone: [phone]: [phone] SAlvera Singh5/06/2018, 1:54 PM  [facility] [address] [place] [address]Phone: [phone]  Fax:  [phone] Name: CJAMYIA FORTUNEMRN: [id number] of Birth: [birth year]

## 2018-05-12 ENCOUNTER — Encounter: Payer: Self-pay | Admitting: Physical Therapy

## 2018-05-12 ENCOUNTER — Ambulatory Visit: Payer: 59 | Admitting: Physical Therapy

## 2018-05-12 ENCOUNTER — Ambulatory Visit: Payer: Self-pay

## 2018-05-12 ENCOUNTER — Ambulatory Visit (INDEPENDENT_AMBULATORY_CARE_PROVIDER_SITE_OTHER): Payer: 59 | Admitting: Sports Medicine

## 2018-05-12 ENCOUNTER — Encounter: Payer: Self-pay | Admitting: Sports Medicine

## 2018-05-12 VITALS — BP 122/80 | HR 92 | Ht 70.0 in | Wt 197.4 lb

## 2018-05-12 DIAGNOSIS — M6281 Muscle weakness (generalized): Secondary | ICD-10-CM | POA: Diagnosis not present

## 2018-05-12 DIAGNOSIS — G8929 Other chronic pain: Secondary | ICD-10-CM

## 2018-05-12 DIAGNOSIS — M7501 Adhesive capsulitis of right shoulder: Secondary | ICD-10-CM | POA: Diagnosis not present

## 2018-05-12 DIAGNOSIS — M7502 Adhesive capsulitis of left shoulder: Secondary | ICD-10-CM

## 2018-05-12 DIAGNOSIS — M25512 Pain in left shoulder: Secondary | ICD-10-CM | POA: Diagnosis not present

## 2018-05-12 DIAGNOSIS — M25612 Stiffness of left shoulder, not elsewhere classified: Secondary | ICD-10-CM

## 2018-05-12 DIAGNOSIS — M25511 Pain in right shoulder: Secondary | ICD-10-CM | POA: Diagnosis not present

## 2018-05-12 DIAGNOSIS — M25611 Stiffness of right shoulder, not elsewhere classified: Secondary | ICD-10-CM | POA: Diagnosis not present

## 2018-05-12 NOTE — Progress Notes (Signed)
Penny Andrade. [place], [place] at [place] - 42 y.o. female MRN [id number]  Date of birth: [birth year]  Visit Date: 05/12/2018  PCP: Marin Olp, MD   Referred by: Marin Olp, MD  Scribe for today's visit: Josepha Pigg, CMA     SUBJECTIVE:  Penny Andrade is here for Follow-up (bilateral shoulder pain)  02/16/2018: Compared to the last office visit on 01/12/18, her previously described B shoulder pain symptoms are improving with improved  B shoulder ROM except for shoulder ER/IR which is still fairly limited. Current symptoms are mild & are nonradiating. She has been going to PT x 8 visits and is still taking her Amitryptaline.  05/12/2018: Compared to the last office visit, her previously described symptoms are worsening. She feels that at times the L shoulder slips out of socket.  Current symptoms are mild at rest and moderate when she uses her arms. The pain comes and goes, it is not constant. She continues to have decreased ROM with internal and external rotation. At times her R arm will feel heavy.  She has been going to PT. She didn't go for 3 weeks because she couldn't get an appointment and she feels like sx have worsened d/t that. Around the same time she stopped the Amitriptyline. L shoulder seems to be worse than R shoulder. She is having some radiation of pain from the left side of her neck. She has been back in PT and feels like sx are gradually improving.  She stopped taking Amitriptyline because it makes her feel out of it. She has been using IBU and heat with some relief.   ROS Reports night time disturbances. Denies fevers, chills, or night sweats. Denies unexplained weight loss. Denies personal history of cancer. Denies changes in bowel or bladder habits. Denies recent unreported falls. Denies new or worsening dyspnea or wheezing. Denies headaches or dizziness.  Denies numbness,  tingling or weakness  In the extremities.  Denies dizziness or presyncopal episodes Denies lower extremity edema    HISTORY & PERTINENT PRIOR DATA:  Prior History reviewed and updated per electronic medical record.  Significant/pertinent history, findings, studies include:  reports that she has never smoked. She has never used smokeless tobacco. No results for input(s): HGBA1C, LABURIC, CREATINE in the last 8760 hours. No specialty comments available. No problems updated.  OBJECTIVE:  VS:  HT:5' 10"  (177.8 cm)   WT:197 lb 6.4 oz (89.5 kg)  BMI:28.32    BP:122/80  HR:92bpm  TEMP: ( )  RESP:95 %   PHYSICAL EXAM: Constitutional: WDWN, Non-toxic appearing. Psychiatric: Alert & appropriately interactive.  Not depressed or anxious appearing. Respiratory: No increased work of breathing.  Trachea Midline Eyes: Pupils are equal.  EOM intact without nystagmus.  No scleral icterus  Vascular Exam: warm to touch no edema  upper extremity neuro exam: unremarkable  MSK Exam: Right shoulder significant.  Left shoulder however does continue to have marked limitations in her apartment as well as a significant amount of pain with heel loading circumduction to the shoulder.  No significant crepitation..  Total shoulder arc is approximately 25 degrees.   ASSESSMENT & PLAN:   1. Adhesive capsulitis of both shoulders     PLAN: Repeat large-volume intra-articular injection performed today for the left shoulder.  Her right shoulder does seem to be doing better.  She is having a moderate degree of pain and we did discuss the addition of  amitriptyline and continuing this on a regular basis.  She will plan to continue working on therapeutic exercises and formal physical therapy should be continued.  Follow-up: Return in about 2 months (around 07/21/2018).      Please see additional documentation for Objective, Assessment and Plan sections. Pertinent additional documentation may be included in  corresponding procedure notes, imaging studies, problem based documentation and patient instructions. Please see these sections of the encounter for additional information regarding this visit.  CMA/ATC served as Education administrator during this visit. History, Physical, and Plan performed by medical provider. Documentation and orders reviewed and attested to.      Gerda Diss, [place] Physician

## 2018-05-12 NOTE — Patient Instructions (Signed)

## 2018-05-12 NOTE — Therapy (Signed)
[facility] [address] [place], [place], [address] Phone: [phone]   Fax:  [phone]  Physical Therapy Treatment  Patient Details  Name: Penny Andrade MRN: [id number] Date of Birth: 1976/07/15 Referring Provider: Dr. Teresa Coombs   Encounter Date: 05/12/2018  PT End of Session - 05/12/18 0843    Visit Number  18    Date for PT Re-Evaluation  06/09/18    Authorization Type  cone [employer]    PT Start Time  0800    PT Stop Time  0844    PT Time Calculation (min)  44 min    Activity Tolerance  Patient tolerated treatment well       Past Medical History:  Diagnosis Date  . Diabetes mellitus without complication ([place])    diagnosed at 42 years old 59. Dr. Delrae Rend  . UTI (urinary tract infection)     Past Surgical History:  Procedure Laterality Date  . none      There were no vitals filed for this visit.  Subjective Assessment - 05/12/18 0800    Subjective  Busy at work.  Sees the doctor today.  I'm goint to talk to him about another injection on the left.   Not as painful recently but really stiff.  I don't have to take medication as much now.   Right shoulder is doing OK.      Currently in Pain?  No/denies    Pain Score  0-No pain    Pain Location  Shoulder    Pain Orientation  Left    Pain Type  Chronic pain    Aggravating Factors   positional          [facility] PT Assessment - 05/12/18 0001      AROM   Right Shoulder Flexion  148 Degrees    Right Shoulder ABduction  141 Degrees    Right Shoulder Internal Rotation  -- L2    Right Shoulder External Rotation  79 Degrees    Left Shoulder Flexion  136 Degrees    Left Shoulder ABduction  151 Degrees    Left Shoulder Internal Rotation  -- L1    Left Shoulder External Rotation  72 Degrees      Strength   Right Shoulder Flexion  4/5    Right Shoulder Extension  4/5    Right Shoulder ABduction  4/5    Right Shoulder Internal Rotation  4/5    Right Shoulder External Rotation  4/5    Left Shoulder Flexion  4/5    Left Shoulder Extension  4/5    Left Shoulder ABduction  4/5    Left Shoulder Internal Rotation  4/5    Left Shoulder External Rotation  4/5                   [facility] Adult PT Treatment/Exercise - 05/12/18 0001      Shoulder Exercises: Sidelying   Other Sidelying Exercises  open books 10x bil    Other Sidelying Exercises  pectoral stretch 3x bil      Shoulder Exercises: Standing   Other Standing Exercises  UE Ranger on wall L 15 15 x /left;  L20 on right 15x      Shoulder Exercises: Pulleys   Flexion  2 minutes    ABduction  2 minutes      Shoulder Exercises: ROM/Strengthening   Other ROM/Strengthening Exercises  sidelying sleeper stretch 5x 20 sec holds left  Moist Heat Therapy   Number Minutes Moist Heat  10 Minutes    Moist Heat Location  Shoulder bil concurrent with manual therapy      Manual Therapy   Manual therapy comments  bil manual pectoral stretch     Joint Mobilization  bil glenohumeral posterior and inferior mobs grade 3 at early, mid and endrange;  mob with movement; contract/relax to gain flexion 3x       Trigger Point Dry Needling - 05/12/18 1257    Consent Given?  Yes    Muscles Treated Upper Body  -- left supraspinatus    Infraspinatus Response  Twitch response elicited;Palpable increased muscle length    Subscapularis Response  Twitch response elicited;Palpable increased muscle length      Left only       PT Short Term Goals - 04/14/18 1829      PT SHORT TERM GOAL #1   Title  The patient will demonstrate basic self care strategies for ROM and pain control    Status  Achieved      PT SHORT TERM GOAL #2   Title  The patient will demonstrate improved right shoulder flexion ROM to 145 and left to 155 degrees needed for overhead reaching    Baseline  previously met    Status  Achieved      PT SHORT TERM GOAL #3   Title  The patient will report 25% less  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      Status  Achieved        PT Long Term Goals - 04/14/18 1829      PT LONG TERM GOAL #1   Title  The patient will be independent in safe self progression of HEP and appropriate gym program    Time  8    Period  Weeks    Status  On-going    Target Date  06/09/18      PT LONG TERM GOAL #2   Title  The patient will have improved right shoulder flexion to 150 and 160 on left for reaching overhead    Time  8    Period  Weeks    Status  On-going      PT LONG TERM GOAL #3   Title  Improved bilateral internal rotation to 45 degree or to mid lumbar region of the back needed for grooming/dressing    Time  8    Period  Weeks    Status  On-going      PT LONG TERM GOAL #4   Title  The patient will have improved shoulder abduction strength to 4/5 and other glenohumeral/scapular muscles to at least 4+/5 for lifting and carrying objects    Time  8    Period  Weeks    Status  On-going      PT LONG TERM GOAL #5   Title  50% improvement in overall pain for sleeping, 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  As a result she had a significant loss of ROM and increased pain.  She has regained 10-15 degrees bilaterally, although her ROM is still not as good as measurements taken in February.  Her pain level is improving although her left shoulder continues to be painful at times.  She plans on asking the doctor at her appt today for an injection.  Recommend continued PT 1-2x/week.    Rehab Potential  Good    Clinical Impairments Affecting  Rehab Potential  none    PT Frequency  2x / week    PT Duration  8 weeks    PT Treatment/Interventions  ADLs/Self Care Home Management;Cryotherapy;Electrical Stimulation;Ultrasound;Moist Heat;Iontophoresis 23m/ml Dexamethasone;Therapeutic activities;Therapeutic exercise;Patient/family education;Neuromuscular re-education;Manual techniques;Taping;Dry needling    PT Next Visit Plan  see how MD appt went;  DN;  manual therapy.  ROM ex's;  modalities to improve soft tissue extensibility        Patient will benefit from skilled therapeutic intervention in order to improve the following deficits and impairments:  Pain, Decreased range of motion, Decreased strength, Hypomobility, Impaired UE functional use  Visit Diagnosis: Stiffness of right shoulder, not elsewhere classified  Stiffness of left shoulder, not elsewhere classified  Chronic right shoulder pain  Acute pain of left shoulder     Problem List Patient Active Problem List   Diagnosis Date Noted  . Adhesive capsulitis 12/03/2017  . DM (diabetes mellitus) type I controlled with eye manifestation ([facility]    SRuben Im PT 05/12/18 12:59 PM Phone: [phone]: [phone] Singh5/16/2019, 12:59 PM  [facility] [address] [place] [address]Phone: [phone]  Fax:  [phone] Name: CMYKAYLAH BALLMANMRN: [id number] of Birth: 408-10-1976

## 2018-05-12 NOTE — Progress Notes (Signed)
PROCEDURE NOTE:  Ultrasound Guided: Injection: Left shoulder, intra-articular Images were obtained and interpreted by myself, Teresa Coombs, DO  Images have been saved and stored to PACS system. Images obtained on: GE S7 Ultrasound machine    DESCRIPTION OF PROCEDURE:  The patient's clinical condition is marked by substantial pain and/or significant functional disability. Other conservative therapy has not provided relief, is contraindicated, or not appropriate. There is a reasonable likelihood that injection will significantly improve the patient's pain and/or functional impairment.   After discussing the risks, benefits and expected outcomes of the injection and all questions were reviewed and answered, the patient wished to undergo the above named procedure.  Verbal consent was obtained.  The ultrasound was used to identify the target structure and adjacent neurovascular structures. The skin was then prepped in sterile fashion and the target structure was injected under direct visualization using sterile technique as below:  PREP: Alcohol and Ethel Chloride APPROACH: posterior, sterile needle exchange technique, 21g 2 in. INJECTATE: 5 cc 1% lidocaine, 2 cc 0.5% Marcaine, 1 cc 85m/mL DepoMedrol and 10 cc normal saline push ASPIRATE: None DRESSING: Band-Aid  Post procedural instructions including recommending icing and warning signs for infection were reviewed.    This procedure was well tolerated and there were no complications.   IMPRESSION: Succesful Ultrasound Guided: Injection

## 2018-05-17 ENCOUNTER — Encounter: Payer: Self-pay | Admitting: Physical Therapy

## 2018-05-17 ENCOUNTER — Ambulatory Visit: Payer: 59 | Admitting: Physical Therapy

## 2018-05-17 DIAGNOSIS — M6281 Muscle weakness (generalized): Secondary | ICD-10-CM | POA: Diagnosis not present

## 2018-05-17 DIAGNOSIS — G8929 Other chronic pain: Secondary | ICD-10-CM | POA: Diagnosis not present

## 2018-05-17 DIAGNOSIS — M25512 Pain in left shoulder: Secondary | ICD-10-CM | POA: Diagnosis not present

## 2018-05-17 DIAGNOSIS — M25511 Pain in right shoulder: Secondary | ICD-10-CM | POA: Diagnosis not present

## 2018-05-17 DIAGNOSIS — M25611 Stiffness of right shoulder, not elsewhere classified: Secondary | ICD-10-CM | POA: Diagnosis not present

## 2018-05-17 DIAGNOSIS — M25612 Stiffness of left shoulder, not elsewhere classified: Secondary | ICD-10-CM | POA: Diagnosis not present

## 2018-05-17 NOTE — Therapy (Signed)
[facility] [address] [place], [place], [address] Phone: [phone]   Fax:  [phone]  Physical Therapy Treatment  Patient Details  Name: Penny Andrade MRN: [id number] Date of Birth: [birth year] Referring Provider: Dr. Teresa Coombs   Encounter Date: 05/17/2018  PT End of Session - 05/17/18 0858    Visit Number  19    Date for PT Re-Evaluation  06/09/18    Authorization Type  cone [employer]    PT Start Time  0845    PT Stop Time  0930    PT Time Calculation (min)  45 min    Activity Tolerance  Patient tolerated treatment well       Past Medical History:  Diagnosis Date  . Diabetes mellitus without complication ([place])    diagnosed at 42 years old 44. Dr. Delrae Rend  . UTI (urinary tract infection)     Past Surgical History:  Procedure Laterality Date  . none      There were no vitals filed for this visit.  Subjective Assessment - 05/17/18 0846    Subjective  Had an injection left shoulder last week.  Initially very sore.  No pain today.      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  No/denies    Pain Score  0-No pain    Pain Location  Shoulder    Pain Type  Chronic pain                       [facility] Adult PT Treatment/Exercise - 05/17/18 0001      Shoulder Exercises: Supine   External Rotation  Right;Left;Weights 1x 30 sec hold    External Rotation Weight (lbs)  2    Other Supine Exercises  2# 3x 30 sec right and left      Shoulder Exercises: Sidelying   Internal Rotation  -- 1x 2# 30 sec hold    ABduction  -- 1x 2# 30 sec hold for static stretching      Shoulder Exercises: Pulleys   Flexion  2 minutes    ABduction  2 minutes      Shoulder Exercises: IT sales professional  -- doorway    Cross Chest Stretch  -- wall    Wall Stretch - Flexion  3 reps    Other Shoulder Stretches  at the counter side to side and up/down for  internal rotation     Other Shoulder Stretches  flexion 3x      Moist Heat Therapy   Number Minutes Moist Heat  10 Minutes    Moist Heat Location  Shoulder bil concurrent with manual therapy               PT Short Term Goals - 05/17/18 0859      PT SHORT TERM GOAL #1   Title  The patient will demonstrate basic self care strategies for ROM and pain control    Status  Achieved      PT SHORT TERM GOAL #2   Title  The patient will demonstrate improved right shoulder flexion ROM to 145 and left to 155 degrees needed for overhead reaching    Status  Achieved      PT SHORT TERM GOAL #3   Title  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  Status  Achieved        PT Long Term Goals - 04/14/18 1829      PT LONG TERM GOAL #1   Title  The patient will be independent in safe self progression of HEP and appropriate gym program    Time  8    Period  Weeks    Status  On-going    Target Date  06/09/18      PT LONG TERM GOAL #2   Title  The patient will have improved right shoulder flexion to 150 and 160 on left for reaching overhead    Time  8    Period  Weeks    Status  On-going      PT LONG TERM GOAL #3   Title  Improved bilateral internal rotation to 45 degree or to mid lumbar region of the back needed for grooming/dressing    Time  8    Period  Weeks    Status  On-going      PT LONG TERM GOAL #4   Title  The patient will have improved shoulder abduction strength to 4/5 and other glenohumeral/scapular muscles to at least 4+/5 for lifting and carrying objects    Time  8    Period  Weeks    Status  On-going      PT LONG TERM GOAL #5   Title  50% improvement in overall pain for sleeping, 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  Endrange stretching discomfort only.  Discussed self stretching strategies [place].      Rehab Potential  Good    Clinical Impairments Affecting Rehab Potential  none    PT Frequency  2x / week    PT Duration  8 weeks    PT Treatment/Interventions  ADLs/Self Care Home Management;Cryotherapy;Electrical Stimulation;Ultrasound;Moist Heat;Iontophoresis 72m/ml Dexamethasone;Therapeutic activities;Therapeutic exercise;Patient/family education;Neuromuscular re-education;Manual techniques;Taping;Dry needling    PT Next Visit Plan   DN;  manual therapy.  ROM ex's;  modalities to improve soft tissue extensibility ;  recheck ROM next visit       Patient will benefit from skilled therapeutic intervention in order to improve the following deficits and impairments:  Pain, Decreased range of motion, Decreased strength, Hypomobility, Impaired UE functional use  Visit Diagnosis: Stiffness of right shoulder, not elsewhere classified  Stiffness of left shoulder, not elsewhere classified  Chronic right shoulder pain  Acute pain of left shoulder  Muscle weakness (generalized)     Problem List Patient Active Problem List   Diagnosis Date Noted  . Adhesive capsulitis 12/03/2017  . 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 [place] [address]Phone: [phone]  Fax:  [phone]  Name: Penny Andrade MRN: [id number] Date of Birth: 1976/09/11

## 2018-05-19 ENCOUNTER — Telehealth: Payer: Self-pay

## 2018-05-19 NOTE — Telephone Encounter (Signed)
Called and let patient know that the visit dated 11/29/18 should count as a physical. I told her that if there were any questions to let us know. She verbalized understanding

## 2018-05-19 NOTE — Telephone Encounter (Signed)
Copied from [place] [id number]. Topic: Inquiry >> May 19, 2018  3:27 PM Pricilla Handler wrote: Reason for CRM: Patient [employer] [facility] [employer] Employee) called wanting to know if the the New Patient/Physical she had with Dr. Yong Channel in December 2018 would cover her [facility] requirement for a Physical by June 27, 2018. Patient would like a call back from Dr. Ansel Bong assistant at [phone].       Thank You!!!

## 2018-05-27 ENCOUNTER — Encounter: Payer: Self-pay | Admitting: Sports Medicine

## 2018-06-02 ENCOUNTER — Encounter: Payer: 59 | Admitting: Physical Therapy

## 2018-06-03 ENCOUNTER — Telehealth: Payer: 59 | Admitting: Family

## 2018-06-03 DIAGNOSIS — B373 Candidiasis of vulva and vagina: Secondary | ICD-10-CM

## 2018-06-03 DIAGNOSIS — B3731 Acute candidiasis of vulva and vagina: Secondary | ICD-10-CM

## 2018-06-03 MED ORDER — FLUCONAZOLE 150 MG PO TABS: 150.0000 mg | ORAL_TABLET | Freq: Once | ORAL | 0 refills | Status: AC

## 2018-06-03 NOTE — Progress Notes (Signed)
Thank you for the details you included in the comment boxes. Those details are very helpful in determining the best course of treatment for you and help [place] to provide the best care.  We are sorry that you are not feeling well. Here is how we plan to help! Based on what you shared with me it looks like you: May have a yeast vaginosis  Vaginosis is an inflammation of the vagina that can result in discharge, itching and pain. The cause is usually a change in the normal balance of vaginal bacteria or an infection. Vaginosis can also result from reduced estrogen levels after menopause.  The most common causes of vaginosis are:   Bacterial vaginosis which results from an overgrowth of one on several organisms that are normally present in your vagina.   Yeast infections which are caused by a naturally occurring fungus called candida.   Vaginal atrophy (atrophic vaginosis) which results from the thinning of the vagina from reduced estrogen levels after menopause.   Trichomoniasis which is caused by a parasite and is commonly transmitted by sexual intercourse.  Factors that increase your risk of developing vaginosis include: Marland Kitchen Medications, such as antibiotics and steroids . Uncontrolled diabetes . Use of hygiene products such as bubble bath, vaginal spray or vaginal deodorant . Douching . Wearing damp or tight-fitting clothing . Using an intrauterine device (IUD) for birth control . Hormonal changes, such as those associated with pregnancy, birth control pills or menopause . Sexual activity . Having a sexually transmitted infection  Your treatment plan is A single Diflucan (fluconazole) 151m tablet once.  I have electronically sent this prescription into the pharmacy that you have chosen.  Be sure to take all of the medication as directed. Stop taking any medication if you develop a rash, tongue swelling or shortness of breath. Mothers who are breast feeding should consider pumping and  discarding their breast milk while on these antibiotics. However, there is no consensus that infant exposure at these doses would be harmful.  Remember that medication creams can weaken latex condoms. .Marland Kitchen  HOME CARE:  Good hygiene may prevent some types of vaginosis from recurring and may relieve some symptoms:  . Avoid baths, hot tubs and whirlpool spas. Rinse soap from your outer genital area after a shower, and dry the area well to prevent irritation. Don't use scented or harsh soaps, such as those with deodorant or antibacterial action. .Marland KitchenAvoid irritants. These include scented tampons and pads. . Wipe from front to back after using the toilet. Doing so avoids spreading fecal bacteria to your vagina.  Other things that may help prevent vaginosis include:  .Marland KitchenDon't douche. Your vagina doesn't require cleansing other than normal bathing. Repetitive douching disrupts the normal organisms that reside in the vagina and can actually increase your risk of vaginal infection. Douching won't clear up a vaginal infection. . Use a latex condom. Both female and female latex condoms may help you avoid infections spread by sexual contact. . Wear cotton underwear. Also wear pantyhose with a cotton crotch. If you feel comfortable without it, skip wearing underwear to bed. Yeast thrives in mCampbell SoupYour symptoms should improve in the next day or two.  GET HELP RIGHT AWAY IF:  . You have pain in your lower abdomen ( pelvic area or over your ovaries) . You develop nausea or vomiting . You develop a fever . Your discharge changes or worsens . You have persistent pain with intercourse . You develop shortness  of breath, a rapid pulse, or you faint.  These symptoms could be signs of problems or infections that need to be evaluated by a medical provider now.  MAKE SURE YOU    Understand these instructions.  Will watch your condition.  Will get help right away if you are not doing well or get  worse.  Your e-visit answers were reviewed by a board certified advanced clinical practitioner to complete your personal care plan. Depending upon the condition, your plan could have included both over the counter or prescription medications. Please review your pharmacy choice to make sure that you have choses a pharmacy that is open for you to pick up any needed prescription, Your safety is important to [place]. If you have drug allergies check your prescription carefully.   You can use MyChart to ask questions about today's visit, request a non-urgent call back, or ask for a work or school excuse for 24 hours related to this e-Visit. If it has been greater than 24 hours you will need to follow up with your provider, or enter a new e-Visit to address those concerns. You will get a MyChart message within the next two days asking about your experience. I hope that your e-visit has been valuable and will speed your recovery.

## 2018-06-07 ENCOUNTER — Telehealth: Payer: 59 | Admitting: Nurse Practitioner

## 2018-06-07 DIAGNOSIS — J0100 Acute maxillary sinusitis, unspecified: Secondary | ICD-10-CM | POA: Diagnosis not present

## 2018-06-07 MED ORDER — FLUTICASONE PROPIONATE 50 MCG/ACT NA SUSP: 2.0000 | Freq: Every day | NASAL | 6 refills | Status: DC

## 2018-06-07 MED ORDER — AMOXICILLIN-POT CLAVULANATE 875-125 MG PO TABS: 1.0000 | ORAL_TABLET | Freq: Two times a day (BID) | ORAL | 0 refills | Status: DC

## 2018-06-07 NOTE — Progress Notes (Signed)
We are sorry that you are not feeling well.  Here is how we plan to help!  Based on what you have shared with me it looks like you have sinusitis.  Sinusitis is inflammation and infection in the sinus cavities of the head.  Based on your presentation I believe you most likely have Acute Bacterial Sinusitis.  This is an infection caused by bacteria and is treated with antibiotics. I have prescribed Augmentin 858m/125mg one tablet twice daily with food, for 7 days. You may use an oral decongestant such as Mucinex D or if you have glaucoma or high blood pressure use plain Mucinex. Also sent in flonase  nasal spray to be used 2puffs daily in each nostril.  If you develop worsening sinus pain, fever or notice severe headache and vision changes, or if symptoms are not better after completion of antibiotic, please schedule an appointment with a health care provider.    Sinus infections are not as easily transmitted as other respiratory infection, however we still recommend that you avoid close contact with loved ones, especially the very young and elderly.  Remember to wash your hands thoroughly throughout the day as this is the number one way to prevent the spread of infection!  Home Care:  Only take medications as instructed by your medical team.  Complete the entire course of an antibiotic.  Do not take these medications with alcohol.  A steam or ultrasonic humidifier can help congestion.  You can place a towel over your head and breathe in the steam from hot water coming from a faucet.  Avoid close contacts especially the very young and the elderly.  Cover your mouth when you cough or sneeze.  Always remember to wash your hands.  Get Help Right Away If:  You develop worsening fever or sinus pain.  You develop a severe head ache or visual changes.  Your symptoms persist after you have completed your treatment plan.  Make sure you  Understand these instructions.  Will watch your  condition.  Will get help right away if you are not doing well or get worse.  Your e-visit answers were reviewed by a board certified advanced clinical practitioner to complete your personal care plan.  Depending on the condition, your plan could have included both over the counter or prescription medications.  If there is a problem please reply  once you have received a response from your provider.  Your safety is important to [place]  If you have drug allergies check your prescription carefully.    You can use MyChart to ask questions about today's visit, request a non-urgent call back, or ask for a work or school excuse for 24 hours related to this e-Visit. If it has been greater than 24 hours you will need to follow up with your provider, or enter a new e-Visit to address those concerns.  You will get an e-mail in the next two days asking about your experience.  I hope that your e-visit has been valuable and will speed your recovery. Thank you for using e-visits.

## 2018-06-09 ENCOUNTER — Encounter: Payer: Self-pay | Admitting: Physical Therapy

## 2018-06-09 ENCOUNTER — Ambulatory Visit: Payer: 59 | Attending: General Practice | Admitting: Physical Therapy

## 2018-06-09 DIAGNOSIS — M25512 Pain in left shoulder: Secondary | ICD-10-CM | POA: Diagnosis not present

## 2018-06-09 DIAGNOSIS — G8929 Other chronic pain: Secondary | ICD-10-CM | POA: Diagnosis not present

## 2018-06-09 DIAGNOSIS — M25611 Stiffness of right shoulder, not elsewhere classified: Secondary | ICD-10-CM | POA: Diagnosis not present

## 2018-06-09 DIAGNOSIS — M6281 Muscle weakness (generalized): Secondary | ICD-10-CM | POA: Insufficient documentation

## 2018-06-09 DIAGNOSIS — M25511 Pain in right shoulder: Secondary | ICD-10-CM | POA: Diagnosis not present

## 2018-06-09 DIAGNOSIS — M25612 Stiffness of left shoulder, not elsewhere classified: Secondary | ICD-10-CM | POA: Diagnosis not present

## 2018-06-09 NOTE — Therapy (Signed)
[facility] [address] [place], [place], [address] Phone: [phone]   Fax:  [phone]  Physical Therapy Treatment/Recertification   Patient Details  Name: Penny Andrade MRN: [id number] Date of Birth: [birth year] Referring Provider: Dr. Teresa Coombs   Encounter Date: 06/09/2018  PT End of Session - 06/09/18 1916    Visit Number  20    Date for PT Re-Evaluation  08/04/18    Authorization Type  cone [employer]    PT Start Time  0845    PT Stop Time  0935    PT Time Calculation (min)  50 min    Activity Tolerance  Patient tolerated treatment well       Past Medical History:  Diagnosis Date  . Diabetes mellitus without complication ([place])    diagnosed at 42 years old 38. Dr. Delrae Rend  . UTI (urinary tract infection)     Past Surgical History:  Procedure Laterality Date  . none      There were no vitals filed for this visit.  Subjective Assessment - 06/09/18 0848    Subjective  Patient returns from vacation.  My shoulders did great.  Had a massage.  I think my ROM is a little bit better.      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  No/denies    Pain Score  0-No pain    Pain Location  Shoulder    Pain Orientation  Right;Left    Pain Type  Chronic pain         [facility] PT Assessment - 06/09/18 0001      AROM   Right Shoulder Flexion  155 Degrees    Right Shoulder ABduction  146 Degrees    Right Shoulder Internal Rotation  -- L1    Right Shoulder External Rotation  78 Degrees    Left Shoulder Flexion  129 Degrees    Left Shoulder ABduction  142 Degrees    Left Shoulder Internal Rotation  -- L1    Left Shoulder External Rotation  75 Degrees      Strength   Right Shoulder Flexion  4/5    Right Shoulder Extension  4/5    Right Shoulder ABduction  4/5    Right Shoulder Internal Rotation  4/5    Right Shoulder External Rotation  4/5    Left  Shoulder Flexion  4/5    Left Shoulder Extension  4/5    Left Shoulder ABduction  4/5    Left Shoulder Internal Rotation  4/5    Left Shoulder External Rotation  4/5                   [facility] Adult PT Treatment/Exercise - 06/09/18 0001      Shoulder Exercises: Supine   External Rotation  Right;Left;Weights 1x 30 sec hold    Flexion  Left;5 reps;Weights    Shoulder Flexion Weight (lbs)  2    Other Supine Exercises  2# 3x 30 sec left      Shoulder Exercises: Standing   ABduction  -- left horizontal adduction on doorjam 3x 20 sec holds    Other Standing Exercises  UE Ranger L18 flexion and scaption 20x    Other Standing Exercises  left flexion on doorjam 20 sec holds      Shoulder Exercises: Pulleys   Other Pulley Exercises  Internal rotation 2 min right/left  Moist Heat Therapy   Number Minutes Moist Heat  5 Minutes concurrent with static stretching      Manual Therapy   Soft tissue mobilization  left upper trap, infraspinatus, subscapularis, pectorals       Trigger Point Dry Needling - 06/09/18 1916    Consent Given?  Yes    Pectoralis Major Response  Twitch response elicited;Palpable increased muscle length    Infraspinatus Response  Palpable increased muscle length    Subscapularis Response  Palpable increased muscle length             PT Short Term Goals - 06/09/18 1927      PT SHORT TERM GOAL #1   Title  The patient will demonstrate basic self care strategies for ROM and pain control    Status  Achieved      PT SHORT TERM GOAL #2   Title  The patient will demonstrate improved right shoulder flexion ROM to 145 and left to 155 degrees needed for overhead reaching    Status  Achieved      PT SHORT TERM GOAL #3   Title  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        PT Long Term Goals - 06/09/18 1927      PT LONG TERM GOAL #1   Title  The patient will be  independent in safe self progression of HEP and appropriate gym program    Time  8    Period  Weeks    Status  On-going    Target Date  08/04/18      PT LONG TERM GOAL #2   Title  The patient will have improved  shoulder flexion to 160 on right and 150 degrees on left  for reaching overhead    Time  8    Period  Weeks    Status  Revised      PT LONG TERM GOAL #3   Title  Improved bilateral internal rotation to 55 degree or mid to lower thoracic region of the back needed for grooming/dressing    Time  8    Period  Weeks    Status  Revised      PT LONG TERM GOAL #4   Title  The patient will have improved shoulder strength to grossly 4+/5  for lifting and carrying objects    Time  8    Period  Weeks    Status  Revised      PT LONG TERM GOAL #5   Title  75% improvement in overall pain for sleeping, 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  Left shoulder flexion and abduction continues to by quite limited but good improvement in internal rotation.  Her strength is available ROM is 4/5 throughout.  She would benefit from  continued PT for further gains in ROM, pain relief.  Without continued rehab she would likely have a decline in ROM (which occurred when patient missed a month of PT).  Will attempt to taper visits as long as measurements remain stable.      Rehab Potential  Good    Clinical Impairments Affecting Rehab Potential  none    PT Duration  8 weeks    PT Treatment/Interventions  ADLs/Self Care Home Management;Cryotherapy;Electrical Stimulation;Ultrasound;Moist Heat;Iontophoresis 57m/ml  Dexamethasone;Therapeutic activities;Therapeutic exercise;Patient/family education;Neuromuscular re-education;Manual techniques;Taping;Dry needling    PT Next Visit Plan   DN;  manual therapy.  ROM ex's bilaterally;   Modalities, recheck FOTO;         Patient will benefit from skilled therapeutic intervention in order to improve the following deficits and impairments:  Pain, Decreased range of motion, Decreased strength, Hypomobility, Impaired UE functional use  Visit Diagnosis: Stiffness of right shoulder, not elsewhere classified - Plan: PT plan of care cert/re-cert  Stiffness of left shoulder, not elsewhere classified - Plan: PT plan of care cert/re-cert  Chronic right shoulder pain - Plan: PT plan of care cert/re-cert  Acute pain of left shoulder - Plan: PT plan of care cert/re-cert  Muscle weakness (generalized) - Plan: PT plan of care cert/re-cert     Problem List Patient Active Problem List   Diagnosis Date Noted  . Adhesive capsulitis 12/03/2017  . 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 [place] [address]Phone: [phone]  Fax:  [phone] Name: Penny MESKILLMRN: [id number] of Birth: [birth year]

## 2018-06-14 ENCOUNTER — Ambulatory Visit: Payer: 59 | Admitting: Physical Therapy

## 2018-06-14 ENCOUNTER — Encounter: Payer: Self-pay | Admitting: Physical Therapy

## 2018-06-14 DIAGNOSIS — G8929 Other chronic pain: Secondary | ICD-10-CM | POA: Diagnosis not present

## 2018-06-14 DIAGNOSIS — M6281 Muscle weakness (generalized): Secondary | ICD-10-CM | POA: Diagnosis not present

## 2018-06-14 DIAGNOSIS — M25512 Pain in left shoulder: Secondary | ICD-10-CM | POA: Diagnosis not present

## 2018-06-14 DIAGNOSIS — M25611 Stiffness of right shoulder, not elsewhere classified: Secondary | ICD-10-CM

## 2018-06-14 DIAGNOSIS — M25511 Pain in right shoulder: Secondary | ICD-10-CM

## 2018-06-14 DIAGNOSIS — M25612 Stiffness of left shoulder, not elsewhere classified: Secondary | ICD-10-CM

## 2018-06-14 NOTE — Therapy (Signed)
[facility] [address], [place], [place], [address] Phone: [phone]   Fax:  [phone]  Physical Therapy Treatment  Patient Details  Name: Penny Andrade MRN: [id number] Date of Birth: 07-Mar-1976 Referring Provider: Dr. Teresa Coombs   Encounter Date: 06/14/2018  PT End of Session - 06/14/18 1556    Visit Number  21    Date for PT Re-Evaluation  08/04/18    Authorization Type  cone UMR    PT Start Time  0845    PT Stop Time  0930    PT Time Calculation (min)  45 min    Activity Tolerance  Patient tolerated treatment well       Past Medical History:  Diagnosis Date  . Diabetes mellitus without complication ([place])    diagnosed at 42 years old 69. Dr. Delrae Rend  . UTI (urinary tract infection)     Past Surgical History:  Procedure Laterality Date  . none      There were no vitals filed for this visit.  Subjective Assessment - 06/14/18 0844    Subjective  No excessive soreness after last visit.  Sleeping OK.    Currently in Pain?  No/denies    Pain Score  0-No pain    Pain Location  Shoulder    Pain Orientation  Right;Left    Pain Type  Chronic pain         [facility] PT Assessment - 06/14/18 0001      Observation/Other Assessments   Focus on Therapeutic Outcomes (FOTO)   36% limitation                    [facility] Adult PT Treatment/Exercise - 06/14/18 0001      Shoulder Exercises: Sidelying   ABduction  Right;Left 1# 1 min static holds      Shoulder Exercises: Standing   Other Standing Exercises  UE Ranger L20 flexion and scaption 20x horizontal abduction.adduction 10x      Shoulder Exercises: Pulleys   Other Pulley Exercises  Internal rotation 2 min right/left      Shoulder Exercises: Therapy Ball   Flexion  Right;Left;5 reps on wall      Shoulder Exercises: ROM/Strengthening   Other ROM/Strengthening Exercises  external rotation 3# supine static stretch 2x 30 sec    Other ROM/Strengthening Exercises  sidelying sleeper stretch 2x 30 sec holds left      Shoulder Exercises: Power Futures trader  lat pull down 20# with 20 sec hold at the top 10x      Manual Therapy   Joint Mobilization  bil glenohumeral posterior and inferior mobs grade 3 at early, mid and endrange;  mob with movement; contract/relax to gain flexion 3x;  prone PA bil grade 3/4 10x                PT Short Term Goals - 06/09/18 1927      PT SHORT TERM GOAL #1   Title  The patient will demonstrate basic self care strategies for ROM and pain control    Status  Achieved      PT SHORT TERM GOAL #2   Title  The patient will demonstrate improved right shoulder flexion ROM to 145 and left to 155 degrees needed for overhead reaching    Status  Achieved      PT SHORT TERM GOAL #3   Title  The patient will report 25% less discomfort with  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      Status  Achieved        PT Long Term Goals - 06/09/18 1927      PT LONG TERM GOAL #1   Title  The patient will be independent in safe self progression of HEP and appropriate gym program    Time  8    Period  Weeks    Status  On-going    Target Date  08/04/18      PT LONG TERM GOAL #2   Title  The patient will have improved  shoulder flexion to 160 on right and 150 degrees on left  for reaching overhead    Time  8    Period  Weeks    Status  Revised      PT LONG TERM GOAL #3   Title  Improved bilateral internal rotation to 55 degree or mid to lower thoracic region of the back needed for grooming/dressing    Time  8    Period  Weeks    Status  Revised      PT LONG TERM GOAL #4   Title  The patient will have improved shoulder strength to grossly 4+/5  for lifting and carrying objects    Time  8    Period  Weeks    Status  Revised      PT LONG TERM GOAL #5   Title  75% improvement in overall pain for sleeping, home ADLS and work duties as a Engineer, mining     Time  8    Period  Weeks    Status  Revised      PT LONG TERM GOAL #6   Title  FOTO functional outcome score improved form 36% limitation to 28% indicating improved function with less pain    Time  8    Period  Weeks    Status  On-going            Plan - 06/14/18 1559    Clinical Impression Statement  The patient reports less overall discomfort in shoulder pain at rest or with sleeping but present with endrange stretching as expected with adhesive capsulitis.  The patient does appear to be transitioning to the "thawing" phase with improved internal rotation noted bilaterally.  Will continue to treat patient regularly to prevent decline in ROM as she had after missing a month of PT.      Rehab Potential  Good    Clinical Impairments Affecting Rehab Potential  none    PT Frequency  2x / week    PT Duration  8 weeks    PT Treatment/Interventions  ADLs/Self Care Home Management;Cryotherapy;Electrical Stimulation;Ultrasound;Moist Heat;Iontophoresis 64m/ml Dexamethasone;Therapeutic activities;Therapeutic exercise;Patient/family education;Neuromuscular re-education;Manual techniques;Taping;Dry needling    PT Next Visit Plan   DN;  manual therapy.  ROM ex's bilaterally;   modalities to improve soft tissue extensibility        Patient will benefit from skilled therapeutic intervention in order to improve the following deficits and impairments:  Pain, Decreased range of motion, Decreased strength, Hypomobility, Impaired UE functional use  Visit Diagnosis: Stiffness of right shoulder, not elsewhere classified  Stiffness of left shoulder, not elsewhere classified  Chronic right shoulder pain  Acute pain of left shoulder  Muscle weakness (generalized)     Problem List Patient Active Problem List   Diagnosis Date Noted  . Adhesive capsulitis 12/03/2017  . 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, [place], [place], [address] Phone: [phone]   Fax:  [phone]  Name: Penny Andrade MRN: [id number] Date of Birth: Aug 07, 1976

## 2018-06-23 ENCOUNTER — Ambulatory Visit: Payer: 59 | Admitting: Physical Therapy

## 2018-06-23 ENCOUNTER — Encounter: Payer: Self-pay | Admitting: Physical Therapy

## 2018-06-23 DIAGNOSIS — M25611 Stiffness of right shoulder, not elsewhere classified: Secondary | ICD-10-CM | POA: Diagnosis not present

## 2018-06-23 DIAGNOSIS — M25511 Pain in right shoulder: Secondary | ICD-10-CM

## 2018-06-23 DIAGNOSIS — M25612 Stiffness of left shoulder, not elsewhere classified: Secondary | ICD-10-CM

## 2018-06-23 DIAGNOSIS — M25512 Pain in left shoulder: Secondary | ICD-10-CM | POA: Diagnosis not present

## 2018-06-23 DIAGNOSIS — G8929 Other chronic pain: Secondary | ICD-10-CM

## 2018-06-23 DIAGNOSIS — M6281 Muscle weakness (generalized): Secondary | ICD-10-CM

## 2018-06-23 DIAGNOSIS — E109 Type 1 diabetes mellitus without complications: Secondary | ICD-10-CM | POA: Diagnosis not present

## 2018-06-23 NOTE — Therapy (Addendum)
[facility] [address], [place], [place], [address] Phone: [phone]   Fax:  [phone]  Physical Therapy Treatment/Discharge Summary   Patient Details  Name: Penny Andrade MRN: [id number] Date of Birth: 1976-12-25 Referring Provider: Dr. Teresa Coombs   Encounter Date: 06/23/2018  PT End of Session - 06/23/18 0829    Visit Number  22    Date for PT Re-Evaluation  08/04/18    Authorization Type  cone UMR    PT Start Time  0800    PT Stop Time  0842    PT Time Calculation (min)  42 min    Activity Tolerance  Patient tolerated treatment well       Past Medical History:  Diagnosis Date  . Diabetes mellitus without complication ([place])    diagnosed at 42 years old 96. Dr. Delrae Rend  . UTI (urinary tract infection)     Past Surgical History:  Procedure Laterality Date  . none      There were no vitals filed for this visit.  Subjective Assessment - 06/23/18 0803    Subjective  Sleepy this morning from sinus medication.      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  No/denies    Pain Score  0-No pain    Pain Orientation  Right;Left         [facility] PT Assessment - 06/23/18 0001      AROM   Right Shoulder Flexion  151 Degrees    Right Shoulder ABduction  150 Degrees    Right Shoulder Internal Rotation  -- T10    Right Shoulder External Rotation  81 Degrees    Left Shoulder Flexion  143 Degrees    Left Shoulder ABduction  153 Degrees    Left Shoulder Internal Rotation  -- T10    Left Shoulder External Rotation  78 Degrees                   [facility] Adult PT Treatment/Exercise - 06/23/18 0001      Shoulder Exercises: Prone   Other Prone Exercises  quadruped thread the needle then reach up and out 5x bil       Shoulder Exercises: Sidelying   Other Sidelying Exercises  doorway pectoral stretch one arm at a time      Shoulder  Exercises: Standing   Other Standing Exercises  UE Ranger L20 flexion and scaption 20x horizontal abduction.adduction 10x    Other Standing Exercises  arcs on wall 15x each side      Shoulder Exercises: Pulleys   Other Pulley Exercises  Internal rotation 2 min right/left      Shoulder Exercises: ROM/Strengthening   UBE (Upper Arm Bike)  2 forward/ 2 backward L1       Shoulder Exercises: Stretch   Other Shoulder Stretches  flexion with foam roll in quadruped 10x      Shoulder Exercises: Power Warden/ranger Exercises  lat pull down 20# with 20 sec hold at the top 10x               PT Short Term Goals - 06/09/18 1927      PT SHORT TERM GOAL #1   Title  The patient will demonstrate basic self care strategies for ROM and pain control    Status  Achieved      PT SHORT  TERM GOAL #2   Title  The patient will demonstrate improved right shoulder flexion ROM to 145 and left to 155 degrees needed for overhead reaching    Status  Achieved      PT SHORT TERM GOAL #3   Title  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        PT Long Term Goals - 06/09/18 1927      PT LONG TERM GOAL #1   Title  The patient will be independent in safe self progression of HEP and appropriate gym program    Time  8    Period  Weeks    Status  On-going    Target Date  08/04/18      PT LONG TERM GOAL #2   Title  The patient will have improved  shoulder flexion to 160 on right and 150 degrees on left  for reaching overhead    Time  8    Period  Weeks    Status  Revised      PT LONG TERM GOAL #3   Title  Improved bilateral internal rotation to 55 degree or mid to lower thoracic region of the back needed for grooming/dressing    Time  8    Period  Weeks    Status  Revised      PT LONG TERM GOAL #4   Title  The patient will have improved shoulder strength to grossly 4+/5  for lifting and carrying objects     Time  8    Period  Weeks    Status  Revised      PT LONG TERM GOAL #5   Title  75% improvement in overall pain for sleeping, 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  Significant improvements on the left which has always been more limited.    Overall pain with ADLs and sleeping has improved as well.   Will continue with progressive ROM and strengthening in newly gained motion to decrease risk for a decline.      Rehab Potential  Good    Clinical Impairments Affecting Rehab Potential  none    PT Frequency  2x / week    PT Duration  8 weeks    PT Treatment/Interventions  ADLs/Self Care Home Management;Cryotherapy;Electrical Stimulation;Ultrasound;Moist Heat;Iontophoresis 90m/ml Dexamethasone;Therapeutic activities;Therapeutic exercise;Patient/family education;Neuromuscular re-education;Manual techniques;Taping;Dry needling    PT Next Visit Plan   DN;  manual therapy.  ROM ex's bilaterally;   modalities to improve soft tissue extensibility        Patient will benefit from skilled therapeutic intervention in order to improve the following deficits and impairments:  Pain, Decreased range of motion, Decreased strength, Hypomobility, Impaired UE functional use  Visit Diagnosis: Stiffness of right shoulder, not elsewhere classified  Stiffness of  left shoulder, not elsewhere classified  Chronic right shoulder pain  Acute pain of left shoulder  Muscle weakness (generalized)    PHYSICAL THERAPY DISCHARGE SUMMARY  Visits from Start of Care: 22  Current functional level related to  goals / functional outcomes: The patient was making steady improvements with shoulder ROM bilaterally in all planes of motion.  On last visit we planned to begin a taper of visits.  She cancelled her last scheduled appointment and she has called to resume.  Her PT chart has been inactive since June.  Will discharge from PT with partial goals met.     Remaining deficits: As above   Education / Equipment: Comprehensive HEP  Plan:                                                    Patient goals were partially met. Patient is being discharged due to not returning since the last visit.  ?????         Problem List Patient Active Problem List   Diagnosis Date Noted  . Adhesive capsulitis 12/03/2017  . DM (diabetes mellitus) type I controlled with eye manifestation ([place])    [name], PT 06/23/18 8:42 AM Phone: [phone] Fax: [phone]  [name] 06/23/2018, 8:42 AM  [facility] [address], [place], [place], [address] Phone: [phone]   Fax:  [phone]  Name: ASHLIE MCMENAMY MRN: [id number] Date of Birth: 03-05-1976

## 2018-06-28 ENCOUNTER — Encounter: Payer: 59 | Admitting: Physical Therapy

## 2018-07-14 ENCOUNTER — Ambulatory Visit: Payer: 59 | Admitting: Family Medicine

## 2018-07-20 ENCOUNTER — Ambulatory Visit: Payer: 59 | Admitting: Sports Medicine

## 2018-07-20 ENCOUNTER — Ambulatory Visit: Payer: 59 | Admitting: Family Medicine

## 2018-07-25 ENCOUNTER — Ambulatory Visit: Payer: 59 | Admitting: Sports Medicine

## 2018-08-05 DIAGNOSIS — E103213 Type 1 diabetes mellitus with mild nonproliferative diabetic retinopathy with macular edema, bilateral: Secondary | ICD-10-CM | POA: Diagnosis not present

## 2018-08-05 DIAGNOSIS — E1065 Type 1 diabetes mellitus with hyperglycemia: Secondary | ICD-10-CM | POA: Diagnosis not present

## 2018-08-05 DIAGNOSIS — Z794 Long term (current) use of insulin: Secondary | ICD-10-CM | POA: Diagnosis not present

## 2018-08-05 LAB — HEMOGLOBIN A1C: Hemoglobin A1C: 8.5

## 2018-08-09 DIAGNOSIS — E109 Type 1 diabetes mellitus without complications: Secondary | ICD-10-CM | POA: Diagnosis not present

## 2018-08-30 ENCOUNTER — Telehealth: Payer: 59 | Admitting: Nurse Practitioner

## 2018-08-30 DIAGNOSIS — B9689 Other specified bacterial agents as the cause of diseases classified elsewhere: Secondary | ICD-10-CM

## 2018-08-30 DIAGNOSIS — N760 Acute vaginitis: Secondary | ICD-10-CM | POA: Diagnosis not present

## 2018-08-30 MED ORDER — METRONIDAZOLE 500 MG PO TABS: 500.0000 mg | ORAL_TABLET | Freq: Two times a day (BID) | ORAL | 0 refills | Status: DC

## 2018-08-30 NOTE — Progress Notes (Signed)

## 2018-09-28 DIAGNOSIS — E109 Type 1 diabetes mellitus without complications: Secondary | ICD-10-CM | POA: Diagnosis not present

## 2018-10-26 ENCOUNTER — Telehealth: Payer: 59 | Admitting: Nurse Practitioner

## 2018-10-26 DIAGNOSIS — B001 Herpesviral vesicular dermatitis: Secondary | ICD-10-CM | POA: Diagnosis not present

## 2018-10-26 MED ORDER — ACYCLOVIR 800 MG PO TABS: 800.0000 mg | ORAL_TABLET | Freq: Two times a day (BID) | ORAL | 0 refills | Status: DC

## 2018-10-26 NOTE — Progress Notes (Signed)
We are sorry that you are not feeling well.  Here is how we plan to help!  Based on what you have shared with me it does look like you have a viral infection.    Most cold sores or fever blisters are small fluid filled blisters around the mouth caused by herpes simplex virus.  The most common strain of the virus causing cold sores is herpes simplex virus 1.  It can be spread by skin contact, sharing eating utensils, or even sharing towels.  Cold sores are contagious to other people until dry. (Approximately 5-7 days).  Wash your hands. You can spread the virus to your eyes through handling your contact lenses after touching the lesions.  Most people experience pain at the sight or tingling sensations in their lips that may begin before the ulcers erupt.  Herpes simplex is treatable but not curable.  It may lie dormant for a long time and then reappear due to stress or prolonged sun exposure.  Many patients have success in treating their cold sores with an over the counter topical called Abreva.  You may apply the cream up to 5 times daily (maximum 10 days) until healing occurs.  If you would like to use an oral antiviral medication to speed the healing of your cold sore, I have sent a prescription to your local pharmacy Acyclovir 800 mg twice a day for 7 days    HOME CARE:   Wash your hands frequently.    Do not pick at or rub the sore.  Don't open the blisters.  Avoid kissing other people during this time.  Avoid sharing drinking glasses, eating utensils, or razors.  Do not handle contact lenses unless you have thoroughly washed your hands with soap and warm water!  Avoid oral sex during this time.  Herpes from sores on your mouth can spread to your partner's genital area.  Avoid contact with anyone who has eczema or a weakened immune system.  Cold sores are often triggered by exposure to intense sunlight, use a lip balm containing a sunscreen (SPF 30 or higher).  GET HELP RIGHT  AWAY IF:   Blisters look infected.  Blisters occur near or in the eye.  Symptoms last longer than 10 days.  Your symptoms become worse.  MAKE SURE YOU:   Understand these instructions.  Will watch your condition.  Will get help right away if you are not doing well or get worse.    Your e-visit answers were reviewed by a board certified advanced clinical practitioner to complete your personal care plan.  Depending upon the condition, your plan could have  Included both over the counter or prescription medications.    Please review your pharmacy choice.  Be sure that the pharmacy you have chosen is open so that you can pick up your prescription now.  If there is a problem you can message your provider in [place] to have the prescription routed to another pharmacy.    Your safety is important to [place].  If you have drug allergies check our prescription carefully.  For the next 24 hours you can use MyChart to ask questions about today's visit, request a non-urgent call back, or ask for a work or school excuse from your e-visit provider.  You will get an email in the next two days asking about your experience.  I hope that your e-visit has been valuable and will speed your recovery.

## 2018-11-04 ENCOUNTER — Other Ambulatory Visit: Payer: Self-pay | Admitting: Family Medicine

## 2018-11-04 DIAGNOSIS — N760 Acute vaginitis: Principal | ICD-10-CM

## 2018-11-04 DIAGNOSIS — B9689 Other specified bacterial agents as the cause of diseases classified elsewhere: Secondary | ICD-10-CM

## 2018-11-04 MED ORDER — METRONIDAZOLE 500 MG PO TABS: 500.0000 mg | ORAL_TABLET | Freq: Two times a day (BID) | ORAL | 0 refills | Status: DC

## 2018-11-08 DIAGNOSIS — E109 Type 1 diabetes mellitus without complications: Secondary | ICD-10-CM | POA: Diagnosis not present

## 2018-11-21 DIAGNOSIS — E103213 Type 1 diabetes mellitus with mild nonproliferative diabetic retinopathy with macular edema, bilateral: Secondary | ICD-10-CM | POA: Diagnosis not present

## 2018-11-21 DIAGNOSIS — Z794 Long term (current) use of insulin: Secondary | ICD-10-CM | POA: Diagnosis not present

## 2018-11-21 DIAGNOSIS — E1065 Type 1 diabetes mellitus with hyperglycemia: Secondary | ICD-10-CM | POA: Diagnosis not present

## 2018-11-21 DIAGNOSIS — E109 Type 1 diabetes mellitus without complications: Secondary | ICD-10-CM | POA: Diagnosis not present

## 2018-11-21 DIAGNOSIS — R946 Abnormal results of thyroid function studies: Secondary | ICD-10-CM | POA: Diagnosis not present

## 2018-11-21 LAB — LIPID PANEL
Cholesterol: 142 (ref 0–200)
HDL: 59 (ref 35–70)
LDL Cholesterol: 71
Triglycerides: 57 (ref 40–160)

## 2018-11-21 LAB — BASIC METABOLIC PANEL
BUN: 10 (ref 4–21)
CO2: 25 — AB (ref 13–22)
Chloride: 105 (ref 99–108)
Creatinine: 0.7 (ref 0.5–1.1)
Glucose: 196
Potassium: 4.4 (ref 3.4–5.3)
Sodium: 137 (ref 137–147)

## 2018-11-21 LAB — MICROALBUMIN, URINE: Microalb, Ur: <0.7

## 2018-11-21 LAB — HEPATIC FUNCTION PANEL
ALT: 20 (ref 7–35)
AST: 20 (ref 13–35)
Alkaline Phosphatase: 41 (ref 25–125)
Bilirubin, Total: 0.9

## 2018-11-21 LAB — COMPREHENSIVE METABOLIC PANEL
Calcium: 9.2 (ref 8.7–10.7)
GFR calc Af Amer: 117
Globulin: 97

## 2018-12-13 ENCOUNTER — Ambulatory Visit (INDEPENDENT_AMBULATORY_CARE_PROVIDER_SITE_OTHER): Payer: 59 | Admitting: Sports Medicine

## 2018-12-13 ENCOUNTER — Encounter: Payer: Self-pay | Admitting: Sports Medicine

## 2018-12-13 VITALS — BP 132/64 | HR 99 | Ht 70.0 in | Wt 193.0 lb

## 2018-12-13 DIAGNOSIS — M216X2 Other acquired deformities of left foot: Secondary | ICD-10-CM | POA: Diagnosis not present

## 2018-12-13 DIAGNOSIS — M216X1 Other acquired deformities of right foot: Secondary | ICD-10-CM

## 2018-12-13 DIAGNOSIS — M7502 Adhesive capsulitis of left shoulder: Secondary | ICD-10-CM | POA: Diagnosis not present

## 2018-12-13 DIAGNOSIS — M21622 Bunionette of left foot: Secondary | ICD-10-CM

## 2018-12-13 DIAGNOSIS — M7501 Adhesive capsulitis of right shoulder: Secondary | ICD-10-CM | POA: Diagnosis not present

## 2018-12-13 DIAGNOSIS — M21621 Bunionette of right foot: Secondary | ICD-10-CM | POA: Diagnosis not present

## 2018-12-13 DIAGNOSIS — R946 Abnormal results of thyroid function studies: Secondary | ICD-10-CM | POA: Diagnosis not present

## 2018-12-13 DIAGNOSIS — R7989 Other specified abnormal findings of blood chemistry: Secondary | ICD-10-CM | POA: Diagnosis not present

## 2018-12-13 NOTE — Patient Instructions (Addendum)
I recommend that you obtain over-the-counter SOLE  medium cushioned insoles.  These can be found at [employer] - or on-line at [employer]  Search for "SOLE Active Medium Shoe Inso

## 2018-12-13 NOTE — Progress Notes (Signed)
Penny Andrade. Penny Andrade, [place] at [facility] [phone]  Penny Andrade - 42 y.o. female MRN [id number]  Date of birth: 1976/04/17  Visit Date: 12/13/2018  PCP: Marin Olp, MD   Referred by: Marin Olp, MD  SUBJECTIVE:   Chief Complaint  Patient presents with  . f/u B shoulder adhesive capsulitis    Steroid inj L shoulder 05/12/18. IBU and heat prn. PT x 4 visits.     HPI: Patient is here for follow-up of bilateral shoulder pain left worse than right.  She continues to have some tightness in the left shoulder but this is minimal.  She has weakness with overhead reaching bilateral shoulders.  Certain movements especially with terminal internal and external rotation cause pain.  It is alleviated with rest and heat.  She is undergone ultrasound-guided intra-articular injections with good improvement in the symptoms.  It did cause her blood sugars to increase however.  She went to physical therapy for 4 visits but has discontinued this due to time commitments.  She has not been as diligent with her home exercise program as she would like but has overall has completed the majority exercises on a fairly regular basis.   Additionally she has questions about her bilateral feet and has pain especially towards the end of the day.  REVIEW OF SYSTEMS: No significant nighttime awakenings due to this issue. Denies fevers, chills, recent weight gain or weight loss.  No night sweats.  She does continue to have some weakness but no numbness or tingling in the extremities.  HISTORY:  Prior history reviewed and updated per electronic medical record.  Patient Active Problem List   Diagnosis Date Noted  . Adhesive capsulitis 12/03/2017  . DM (diabetes mellitus) type I controlled with eye manifestation ([place])     diagnosed at 42 years old 37. Dr. Delrae Rend. Humalog insulin pump. Pneumovax sept 2017.     Social History   Occupational  History  . Not on file  Tobacco Use  . Smoking status: Never Smoker  . Smokeless tobacco: Never Used  Substance and Sexual Activity  . Alcohol use: Yes    Comment: drinks on average once weekly  . Drug use: No  . Sexual activity: Yes    Birth control/protection: None   Social History   Social History Narrative   Married Sept 2018. Lives with husband. Mom keeps her yorkiepoo. Son is 2 years old in 2018. Patient's husband has disabled son born 49 who is nonverbal from seizure activity- requires full time care.       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     OBJECTIVE:  VS:  HT:5' 10"  (177.8 cm)   WT:193 lb (87.5 kg)  BMI:27.69    BP:132/64  HR:99bpm  TEMP: ( )  RESP:98 %   PHYSICAL EXAM: Adult female.  No acute distress.  Alert and appropriate.  Her bilateral shoulders have improved arc with still a 40 degrees deficit on the left and a 30 degree deficit on the right.  She has intrinsic rotator cuff strength that is intact.  Positive Hawkins bilaterally worse on the left than the right.  Bilateral feet are  painful with metatarsal squeeze.  She has early bunionette formation.   ASSESSMENT:   1. Adhesive capsulitis of both shoulders   2. Loss of transverse plantar arch of left foot   3. Loss of transverse plantar arch of right foot   4. Tailor's bunion of both feet     PROCEDURES:  US Guided Injection per procedure note      PLAN:  Pertinent additional documentation may be included in corresponding procedure notes, imaging studies, problem based documentation and patient instructions.  No problem-specific Assessment & Plan notes found for this encounter.   Ultimately her shoulders are slowly improving.  She has  regained the majority of her range of motion but still is having terminal deficits.  Continue with home therapeutic exercises encouraged.  We discussed multiple options for her feet and including custom insoles but she should do well with over-the-counter insoles with metatarsal support.  Continue previously prescribed home exercise program.   Activity modifications and the importance of avoiding exacerbating activities (limiting pain to no more than a 4 / 10 during or following activity) recommended and discussed.  Discussed red flag symptoms that warrant earlier emergent evaluation and patient voices understanding.  Return if symptoms worsen or fail to improve.          Gerda Diss, [facility] Physician

## 2018-12-23 DIAGNOSIS — E0500 Thyrotoxicosis with diffuse goiter without thyrotoxic crisis or storm: Secondary | ICD-10-CM | POA: Diagnosis not present

## 2018-12-23 DIAGNOSIS — E103213 Type 1 diabetes mellitus with mild nonproliferative diabetic retinopathy with macular edema, bilateral: Secondary | ICD-10-CM | POA: Diagnosis not present

## 2018-12-23 DIAGNOSIS — Z5181 Encounter for therapeutic drug level monitoring: Secondary | ICD-10-CM | POA: Diagnosis not present

## 2018-12-23 DIAGNOSIS — E0590 Thyrotoxicosis, unspecified without thyrotoxic crisis or storm: Secondary | ICD-10-CM | POA: Diagnosis not present

## 2018-12-23 DIAGNOSIS — E1065 Type 1 diabetes mellitus with hyperglycemia: Secondary | ICD-10-CM | POA: Diagnosis not present

## 2018-12-23 DIAGNOSIS — Z794 Long term (current) use of insulin: Secondary | ICD-10-CM | POA: Diagnosis not present

## 2019-01-04 DIAGNOSIS — E0500 Thyrotoxicosis with diffuse goiter without thyrotoxic crisis or storm: Secondary | ICD-10-CM | POA: Diagnosis not present

## 2019-01-04 DIAGNOSIS — E0590 Thyrotoxicosis, unspecified without thyrotoxic crisis or storm: Secondary | ICD-10-CM | POA: Diagnosis not present

## 2019-01-04 DIAGNOSIS — D509 Iron deficiency anemia, unspecified: Secondary | ICD-10-CM | POA: Diagnosis not present

## 2019-01-04 DIAGNOSIS — Z79899 Other long term (current) drug therapy: Secondary | ICD-10-CM | POA: Diagnosis not present

## 2019-01-04 LAB — IRON,TIBC AND FERRITIN PANEL: Ferritin: 18.8

## 2019-01-06 DIAGNOSIS — E109 Type 1 diabetes mellitus without complications: Secondary | ICD-10-CM | POA: Diagnosis not present

## 2019-01-10 ENCOUNTER — Encounter: Payer: Self-pay | Admitting: Family Medicine

## 2019-01-18 DIAGNOSIS — D509 Iron deficiency anemia, unspecified: Secondary | ICD-10-CM | POA: Diagnosis not present

## 2019-01-18 DIAGNOSIS — E0590 Thyrotoxicosis, unspecified without thyrotoxic crisis or storm: Secondary | ICD-10-CM | POA: Diagnosis not present

## 2019-01-18 DIAGNOSIS — E0500 Thyrotoxicosis with diffuse goiter without thyrotoxic crisis or storm: Secondary | ICD-10-CM | POA: Diagnosis not present

## 2019-01-19 ENCOUNTER — Telehealth: Payer: 59 | Admitting: Family

## 2019-01-19 DIAGNOSIS — R21 Rash and other nonspecific skin eruption: Secondary | ICD-10-CM

## 2019-01-19 MED ORDER — PREDNISONE 10 MG PO TABS: 5.0000 mg | ORAL_TABLET | Freq: Every day | ORAL | 0 refills | Status: DC

## 2019-01-19 MED ORDER — TRIAMCINOLONE ACETONIDE 0.1 % EX CREA: 1.0000 "application " | TOPICAL_CREAM | Freq: Two times a day (BID) | CUTANEOUS | 0 refills | Status: DC

## 2019-01-19 NOTE — Progress Notes (Signed)
Thank you for the details you included in the comment boxes. Those details are very helpful in determining the best course of treatment for you and help [place] to provide the best care.  E Visit for Rash  We are sorry that you are not feeling well. Here is how we plan to help!  Based on what you shared with me it looks like you have contact dermatitis.  Contact dermatitis is a skin rash caused by something that touches the skin and causes irritation or inflammation.  Your skin may be red, swollen, dry, cracked, and itch.  The rash should go away in a few days but can last a few weeks.  If you get a rash, it's important to figure out what caused it so the irritant can be avoided in the future. and I have prescribed Prednisone 10 mg daily for 5 days  Along with triamcinolone cream 0.1% PO BID until resolved.  (the oral prednisone is optional if the cream isn't working; if it is internal causing external rash, you will need the oral prednisone)  HOME CARE:   Take cool showers and avoid direct sunlight.  Apply cool compress or wet dressings.  Take a bath in an oatmeal bath.  Sprinkle content of one Aveeno packet under running faucet with comfortably warm water.  Bathe for 15-20 minutes, 1-2 times daily.  Pat dry with a towel. Do not rub the rash.  Use hydrocortisone cream.  Take an antihistamine like Benadryl for widespread rashes that itch.  The adult dose of Benadryl is 25-50 mg by mouth 4 times daily.  Caution:  This type of medication may cause sleepiness.  Do not drink alcohol, drive, or operate dangerous machinery while taking antihistamines.  Do not take these medications if you have prostate enlargement.  Read package instructions thoroughly on all medications that you take.  GET HELP RIGHT AWAY IF:   Symptoms don't go away after treatment.  Severe itching that persists.  If you rash spreads or swells.  If you rash begins to smell.  If it blisters and opens or develops a  yellow-brown crust.  You develop a fever.  You have a sore throat.  You become short of breath.  MAKE SURE YOU:  Understand these instructions. Will watch your condition. Will get help right away if you are not doing well or get worse.  Thank you for choosing an e-visit. Your e-visit answers were reviewed by a board certified advanced clinical practitioner to complete your personal care plan. Depending upon the condition, your plan could have included both over the counter or prescription medications. Please review your pharmacy choice. Be sure that the pharmacy you have chosen is open so that you can pick up your prescription now.  If there is a problem you may message your provider in [place] to have the prescription routed to another pharmacy. Your safety is important to [place]. If you have drug allergies check your prescription carefully.  For the next 24 hours, you can use MyChart to ask questions about today's visit, request a non-urgent call back, or ask for a work or school excuse from your e-visit provider. You will get an email in the next two days asking about your experience. I hope that your e-visit has been valuable and will speed your recovery.

## 2019-01-25 ENCOUNTER — Other Ambulatory Visit: Payer: Self-pay | Admitting: Family Medicine

## 2019-01-25 MED ORDER — METRONIDAZOLE 500 MG PO TABS: 500.0000 mg | ORAL_TABLET | Freq: Two times a day (BID) | ORAL | 0 refills | Status: AC

## 2019-01-26 DIAGNOSIS — E0590 Thyrotoxicosis, unspecified without thyrotoxic crisis or storm: Secondary | ICD-10-CM | POA: Diagnosis not present

## 2019-02-06 ENCOUNTER — Ambulatory Visit: Payer: 59 | Admitting: Podiatry

## 2019-02-07 ENCOUNTER — Encounter: Payer: Self-pay | Admitting: Family Medicine

## 2019-02-07 ENCOUNTER — Ambulatory Visit (INDEPENDENT_AMBULATORY_CARE_PROVIDER_SITE_OTHER): Payer: 59 | Admitting: Family Medicine

## 2019-02-07 ENCOUNTER — Other Ambulatory Visit (HOSPITAL_COMMUNITY)
Admission: RE | Admit: 2019-02-07 | Discharge: 2019-02-07 | Disposition: A | Discharge status: Home / Self Care | Payer: 59 | Source: Ambulatory Visit | Attending: Family Medicine | Admitting: Family Medicine

## 2019-02-07 VITALS — BP 100/68 | HR 93 | Temp 98.3°F | Ht 70.0 in | Wt 184.8 lb

## 2019-02-07 DIAGNOSIS — Z23 Encounter for immunization: Secondary | ICD-10-CM | POA: Diagnosis not present

## 2019-02-07 DIAGNOSIS — Z124 Encounter for screening for malignant neoplasm of cervix: Secondary | ICD-10-CM

## 2019-02-07 DIAGNOSIS — Z0100 Encounter for examination of eyes and vision without abnormal findings: Secondary | ICD-10-CM | POA: Diagnosis not present

## 2019-02-07 DIAGNOSIS — E0500 Thyrotoxicosis with diffuse goiter without thyrotoxic crisis or storm: Secondary | ICD-10-CM | POA: Diagnosis not present

## 2019-02-07 DIAGNOSIS — Z0000 Encounter for general adult medical examination without abnormal findings: Secondary | ICD-10-CM | POA: Diagnosis not present

## 2019-02-07 DIAGNOSIS — E10311 Type 1 diabetes mellitus with unspecified diabetic retinopathy with macular edema: Secondary | ICD-10-CM

## 2019-02-07 DIAGNOSIS — Z6826 Body mass index (BMI) 26.0-26.9, adult: Secondary | ICD-10-CM | POA: Diagnosis not present

## 2019-02-07 DIAGNOSIS — E109 Type 1 diabetes mellitus without complications: Secondary | ICD-10-CM | POA: Diagnosis not present

## 2019-02-07 LAB — LIPID PANEL
Cholesterol: 158 mg/dL (ref 0–200)
HDL: 50.1 mg/dL (ref >39.00)
LDL Cholesterol: 87 mg/dL (ref 0–99)
NonHDL: 107.89
Total CHOL/HDL Ratio: 3
Triglycerides: 103 mg/dL (ref 0.0–149.0)
VLDL: 20.6 mg/dL (ref 0.0–40.0)

## 2019-02-07 LAB — CBC
HCT: 38.7 % (ref 36.0–46.0)
Hemoglobin: 12.4 g/dL (ref 12.0–15.0)
MCHC: 32 g/dL (ref 30.0–36.0)
MCV: 71.4 fl — ABNORMAL LOW (ref 78.0–100.0)
Platelets: 185 10*3/uL (ref 150.0–400.0)
RBC: 5.42 Mil/uL — ABNORMAL HIGH (ref 3.87–5.11)
RDW: 15.8 % — ABNORMAL HIGH (ref 11.5–15.5)
WBC: 3.1 10*3/uL — ABNORMAL LOW (ref 4.0–10.5)

## 2019-02-07 LAB — COMPREHENSIVE METABOLIC PANEL
ALT: 18 U/L (ref 0–35)
AST: 22 U/L (ref 0–37)
Albumin: 4.1 g/dL (ref 3.5–5.2)
Alkaline Phosphatase: 56 U/L (ref 39–117)
BUN: 7 mg/dL (ref 6–23)
CO2: 28 mEq/L (ref 19–32)
CREATININE: 0.7 mg/dL (ref 0.40–1.20)
Calcium: 8.6 mg/dL (ref 8.4–10.5)
Chloride: 101 mEq/L (ref 96–112)
GFR: 109.57 mL/min (ref >60.00)
Glucose, Bld: 261 mg/dL — ABNORMAL HIGH (ref 70–99)
Potassium: 3.8 mEq/L (ref 3.5–5.1)
SODIUM: 136 meq/L (ref 135–145)
Total Bilirubin: 0.5 mg/dL (ref 0.2–1.2)
Total Protein: 6.7 g/dL (ref 6.0–8.3)

## 2019-02-07 LAB — MICROALBUMIN / CREATININE URINE RATIO
Creatinine,U: 395.4 mg/dL
MICROALB UR: 6.6 mg/dL — AB (ref 0.0–1.9)
Microalb Creat Ratio: 1.7 mg/g (ref 0.0–30.0)

## 2019-02-07 LAB — POCT GLYCOSYLATED HEMOGLOBIN (HGB A1C): Hemoglobin A1C: 8.6 % — AB (ref 4.0–5.6)

## 2019-02-07 LAB — HM DIABETES EYE EXAM

## 2019-02-07 NOTE — Assessment & Plan Note (Addendum)
On methimazole through Dr. Buddy Duty- diagnosed dec 2019. Also on atenolol

## 2019-02-07 NOTE — Patient Instructions (Addendum)
Health Maintenance Due  Topic Date Due  . PNEUMOCOCCAL POLYSACCHARIDE VACCINE AGE 45-64 HIGH RISK -done at [facility]- Sign release of information at the check out desk for all immunizations form [facility]  04/03/1976  . OPHTHALMOLOGY EXAM -appointment today 04/03/1984  . URINE MICROALBUMIN -ordered today 04/03/1984  . TETANUS/TDAP - will give today 04/03/1993  . HEMOGLOBIN A1C -done today at 8.6- I like your idea of getting back with the sensor and following up with Dr. Buddy Duty 10/23/2017  . FOOT EXAM -completed today 11/29/2018   Please stop by lab before you go If you do not have mychart- we will call you about results within 5 business days of [place] receiving them.  If you have mychart- we will send your results within 3 business days of [place] receiving them.  If abnormal or we want to clarify a result, we will call or mychart you to make sure you receive the message.  If you have questions or concerns or don't hear within 5-7 days, please send [place] a message or call us.

## 2019-02-07 NOTE — Addendum Note (Signed)
Addended by: Mariam Dollar, Lonia Mad on: 02/07/2019 02:30 PM   Modules accepted: Orders

## 2019-02-07 NOTE — Progress Notes (Signed)
Phone: [phone]   Subjective:  Patient presents today for their annual physical. Chief complaint-noted.   See problem oriented charting- ROS- full  review of systems was completed and negative except for: chest congestion with tightness, cough  BMI monitoring- elevated BMI noted: Body mass index is 26.52 kg/m. Encouraged need for healthy eating, regular exercise, weight loss.   BMI Metric Follow Up - 02/07/19 1349      BMI Metric Follow Up-Please document annually   BMI Metric Follow Up  Education provided      The following were reviewed and entered/updated in epic: Past Medical History:  Diagnosis Date  . Diabetes mellitus without complication ([place])    diagnosed at 43 years old 66. Dr. Delrae Rend  . UTI (urinary tract infection)    Patient Active Problem List   Diagnosis Date Noted  . 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 Graves disease 02/07/2019    Priority: Low  . Adhesive capsulitis 12/03/2017    Priority: Low   Past Surgical History:  Procedure Laterality Date  . none      Family History  Problem Relation Age of Onset  . Hypertension Mother   . Arthritis Mother   . Hyperlipidemia Father   . Hypertension Father   . Atrial fibrillation Father        cardioversion and ablation.   . Breast cancer Paternal Grandmother   . Pancreatic cancer Paternal Grandfather   . Multiple myeloma Brother     Medications- reviewed and updated Current Outpatient Medications  Medication Sig Dispense Refill  . atenolol (TENORMIN) 25 MG tablet     . Biotin 10000 MCG TABS Take by mouth daily.    . insulin lispro (HUMALOG) 100 UNIT/ML injection Inject into the skin 3 (three) times daily before meals.    . methimazole (TAPAZOLE) 10 MG tablet     . Polysaccharide Iron Complex (NU-IRON PO) Take 1 tablet by mouth daily.     No current facility-administered medications for this visit.    Allergies-reviewed and  updated Allergies  Allergen Reactions  . Meloxicam Other (See Comments)    Other Reaction: itchy    Social History   Social History Narrative   Married Sept 2018. Lives with husband. Mom keeps her yorkiepoo. Son is 60 years old in 2018. Patient's husband has disabled son born 9 who is nonverbal from seizure activity- requires full time care.       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    Objective  Objective:  BP 100/68 (BP Location: Left Arm, Patient Position: Sitting, Cuff Size: Large)   Pulse 93   Temp 98.3 F (36.8 C) (Oral)   Ht 5' 10" (1.778 m)   Wt 184 lb 12.8 oz (83.8 kg)   SpO2 99%   BMI 26.52 kg/m  Gen: NAD, resting comfortably HEENT: Mucous membranes are moist. Oropharynx normal Neck: no thyromegaly CV: RRR no murmurs rubs or gallops Lungs: CTAB no crackles, wheeze, rhonchi Abdomen: soft/nontender/nondistended/normal bowel sounds. No rebound or guarding.  Insulin pump in place Ext: no edema Skin: warm, dry Neuro: grossly normal, moves all extremities, PERRLA   Diabetic Foot Exam -  Simple   Simple Foot Form Diabetic Foot exam was performed with the following findings:  Yes 02/07/2019  1:36 PM  Visual Inspection No deformities, no ulcerations, no other skin breakdown bilaterally:  Yes Sensation Testing Intact to touch and monofilament testing bilaterally:  Yes Pulse Check Posterior Tibialis and Dorsalis pulse intact bilaterally:  Yes Comments       Assessment and Plan   43 y.o. female presenting for annual physical.  Health Maintenance counseling: 1. Anticipatory guidance: Patient counseled regarding regular dental exams -q6 months, eye exams - scheduled for today for diabetic eye exam,  avoiding smoking  and second hand smoke , limiting alcohol to 1 beverage per day- 1 drink every 2-3 weeks .   2. Risk factor reduction:  Advised patient of need for regular exercise and diet rich and fruits and vegetables to reduce risk of heart attack and stroke. Exercise- has tried to keep up 3 times a week- has been harder as picked up second job with hospice in [place]. Diet-diagnosed with Graves disease with Dr. Buddy Duty after losing 10 lbs- has been on medicine since end of December- appetite has not come back .  Weight management-her goal has been to get down to 180- last physical was at 195 Wt Readings from Last 3 Encounters:  02/07/19 184 lb 12.8 oz (83.8 kg)  12/13/18 193 lb (87.5 kg)  05/12/18 197 lb 6.4 oz (89.5 kg)  3. Immunizations/screenings/ancillary studies-had flu shot at work. Immunization History  Administered Date(s) Administered  . Influenza-Unspecified 09/27/2017, 10/07/2018  . PPD Test 01/05/2018   Health Maintenance Due  Topic Date Due  . PNEUMOCOCCAL POLYSACCHARIDE VACCINE AGE 35-64 HIGH RISK -done at [facility] 04/03/1976  . OPHTHALMOLOGY EXAM -appointment today 04/03/1984  . URINE MICROALBUMIN -ordered today 04/03/1984  . TETANUS/TDAP  04/03/1993  . HEMOGLOBIN A1C -done today 10/23/2017  . INFLUENZA VACCINE -updated 07/28/2018  . FOOT EXAM -completed today 11/29/2018   4. Cervical cancer screening- saw physicians for women's for fertility only.  Last Pap smear was September 2017 with a repeat planned in 3 years- we will go ahead and update today  5. Breast cancer screening-  breast exam declines and mammogram-yearly with the breast center- around 2 years at this point and she plans to call to schedule. Does self exams.   6. Colon cancer screening -  No family history, start at age 71  7. Skin cancer screening-no dermatologist. Advised regular sunscreen use. Denies worrisome, changing, or new skin lesions.  8. Birth control/STD check- has tried 2 rounds of IVF- she has stopped at this  point. Right now what will be will be . No birth control needed in that case and monogamous  9. Osteoporosis screening at 65-we will plan on this -10.  Never smoker  Status of chronic or acute concerns   Bilateral shoulder pain-referred to Dr. Paulla Fore last year- ended up after being right alone also occurring in left - had adhesive capsulitis which was slow to heal-last visit with him was in December- she wants to continue to work through it  Diabetes type 1 with eye manifestations- follows with Dr. Buddy Duty.  On insulin pump.  Had Pneumovax September 2017. - thinks last a1c was in the 8s with DR. Buddy Duty- update today was 8.6.  - she has closed system medtronic and needs to start wearing her sensor again- skin is sensitive to adhesive so that has been tough.  - she is going to try to restart with sensor and follow up with Dr.  Buddy Duty- she had huge improvement previously with this.  -History of retinopathy with macular edema right eye- we will need to get records after exam today   Has cough , tough to take a deep breath. Had sinus congestion- that has now improved. Day 8 or 9 right now- has taken OTC like mucinex and theraflu. Had sick patient cough right in her face. Most aggravating thing is the cough. Wants to give it some more time. Had been in bed 2 days to get some rest and that helps some.   Graves disease On methimazole through Dr. Buddy Duty- diagnosed dec 2019. 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  Garret Reddish, MD

## 2019-02-07 NOTE — Addendum Note (Signed)
Addended by: Lyndle Herrlich on: 02/07/2019 02:16 PM   Modules accepted: Orders

## 2019-02-09 LAB — CYTOLOGY - PAP
Diagnosis: NEGATIVE
HPV: NOT DETECTED

## 2019-02-10 ENCOUNTER — Telehealth: Payer: Self-pay | Admitting: Family Medicine

## 2019-02-10 DIAGNOSIS — E109 Type 1 diabetes mellitus without complications: Secondary | ICD-10-CM | POA: Diagnosis not present

## 2019-02-10 NOTE — Telephone Encounter (Signed)
See note

## 2019-02-10 NOTE — Telephone Encounter (Signed)
Called pt phone did not ring.

## 2019-02-10 NOTE — Telephone Encounter (Signed)
Copied from [place] [phone]. Topic: Quick Communication - See Telephone Encounter >> Feb 10, 2019  3:42 PM Blase Mess A wrote: CRM for notification. See Telephone encounter for: 02/10/19.  Patient missed a call. Please call back and leave a message. Please advise Thank you

## 2019-02-13 ENCOUNTER — Encounter: Payer: 59 | Admitting: Family Medicine

## 2019-03-01 DIAGNOSIS — E103213 Type 1 diabetes mellitus with mild nonproliferative diabetic retinopathy with macular edema, bilateral: Secondary | ICD-10-CM | POA: Diagnosis not present

## 2019-03-01 DIAGNOSIS — E1065 Type 1 diabetes mellitus with hyperglycemia: Secondary | ICD-10-CM | POA: Diagnosis not present

## 2019-03-01 DIAGNOSIS — Z79899 Other long term (current) drug therapy: Secondary | ICD-10-CM | POA: Diagnosis not present

## 2019-03-01 DIAGNOSIS — Z794 Long term (current) use of insulin: Secondary | ICD-10-CM | POA: Diagnosis not present

## 2019-03-01 DIAGNOSIS — E0590 Thyrotoxicosis, unspecified without thyrotoxic crisis or storm: Secondary | ICD-10-CM | POA: Diagnosis not present

## 2019-03-01 DIAGNOSIS — E0500 Thyrotoxicosis with diffuse goiter without thyrotoxic crisis or storm: Secondary | ICD-10-CM | POA: Diagnosis not present

## 2019-03-06 DIAGNOSIS — E109 Type 1 diabetes mellitus without complications: Secondary | ICD-10-CM | POA: Diagnosis not present

## 2019-04-12 ENCOUNTER — Other Ambulatory Visit: Payer: Self-pay

## 2019-04-12 ENCOUNTER — Ambulatory Visit (INDEPENDENT_AMBULATORY_CARE_PROVIDER_SITE_OTHER): Payer: Self-pay | Admitting: Physician Assistant

## 2019-04-12 DIAGNOSIS — Z111 Encounter for screening for respiratory tuberculosis: Secondary | ICD-10-CM

## 2019-04-12 NOTE — Progress Notes (Signed)
Patient presents for PPD placement for work Denies previous positive TB test  Denies known exposure to TB   Tuberculin skin test applied to left ventral forearm.  Patient informed to return to [facility] in 48-72 hours for PPD read. Vaccine Information Statement provided to patient.

## 2019-04-18 LAB — TB SKIN TEST
Induration: 0 mm
TB Skin Test: NEGATIVE

## 2019-04-27 ENCOUNTER — Telehealth: Payer: 59 | Admitting: Physician Assistant

## 2019-04-27 DIAGNOSIS — R21 Rash and other nonspecific skin eruption: Secondary | ICD-10-CM | POA: Diagnosis not present

## 2019-04-27 MED ORDER — TRIAMCINOLONE ACETONIDE 0.1 % EX CREA: 1.0000 "application " | TOPICAL_CREAM | Freq: Two times a day (BID) | CUTANEOUS | 0 refills | Status: DC

## 2019-04-27 MED ORDER — PREDNISONE 10 MG (21) PO TBPK: ORAL_TABLET | ORAL | 0 refills | Status: DC

## 2019-04-27 NOTE — Progress Notes (Signed)
E Visit for Rash  We are sorry that you are not feeling well. Here is how we plan to help!  Based on what you shared with me, it sounds like you have urticaria or contact dermatitis.  Contact dermatitis is a skin rash caused by something that touches the skin and causes irritation or inflammation.  Your skin may be red, swollen, dry, cracked, and itch.  The rash should go away in a few days but can last a few weeks.  If you get a rash, it's important to figure out what caused it so the irritant can be avoided in the future.  Triamcinolone cream applied topically 2 times a day. If no improvement, may use prednisone for diffuse reaction.   Prednisone 10 mg daily for 6 days (see taper instructions below)   Prednisone is a steroid and can cause side effects such as headache, irritability, nausea, vomiting, increased heart rate, increased blood pressure, increased blood sugar, appetite changes, and insomnia. Please take tablets in the morning with a full meal to help decrease the chances of these side effects.     HOME CARE:   Take cool showers and avoid direct sunlight.  Apply cool compress or wet dressings.  Take a bath in an oatmeal bath.  Sprinkle content of one Aveeno packet under running faucet with comfortably warm water.  Bathe for 15-20 minutes, 1-2 times daily.  Pat dry with a towel. Do not rub the rash.  Use hydrocortisone cream.  Take an antihistamine like Benadryl for widespread rashes that itch.  The adult dose of Benadryl is 25-50 mg by mouth 4 times daily.  Caution:  This type of medication may cause sleepiness.  Do not drink alcohol, drive, or operate dangerous machinery while taking antihistamines.  Do not take these medications if you have prostate enlargement.  Read package instructions thoroughly on all medications that you take.  GET HELP RIGHT AWAY IF:   Symptoms don't go away after treatment.  Severe itching that persists.  If you rash spreads or swells.  If  you rash begins to smell.  If it blisters and opens or develops a yellow-brown crust.  You develop a fever.  You have a sore throat.  You become short of breath.  MAKE SURE YOU:  Understand these instructions. Will watch your condition. Will get help right away if you are not doing well or get worse.  Thank you for choosing an e-visit. Your e-visit answers were reviewed by a board certified advanced clinical practitioner to complete your personal care plan. Depending upon the condition, your plan could have included both over the counter or prescription medications. Please review your pharmacy choice. Be sure that the pharmacy you have chosen is open so that you can pick up your prescription now.  If there is a problem you may message your provider in [place] to have the prescription routed to another pharmacy. Your safety is important to [place]. If you have drug allergies check your prescription carefully.  For the next 24 hours, you can use MyChart to ask questions about today's visit, request a non-urgent call back, or ask for a work or school excuse from your e-visit provider. You will get an email in the next two days asking about your experience. I hope that your e-visit has been valuable and will speed your recovery.  I have spent 5 minutes in review of e-visit questionnaire, review and updating patient chart, medical decision making and response to patient.    [place]  Timmothy Euler, PA-C

## 2019-04-29 ENCOUNTER — Ambulatory Visit (INDEPENDENT_AMBULATORY_CARE_PROVIDER_SITE_OTHER): Payer: Self-pay | Admitting: Physician Assistant

## 2019-04-29 DIAGNOSIS — Z111 Encounter for screening for respiratory tuberculosis: Secondary | ICD-10-CM

## 2019-05-01 LAB — TB SKIN TEST
Induration: 0 mm
TB Skin Test: NEGATIVE

## 2019-05-01 NOTE — Progress Notes (Signed)
Patient presents for PPD placement for work. Denies previous positive TB test  Denies known exposure to TB   Tuberculin skin test applied to left ventral forearm.  Patient informed to return to [facility] in 48-72 hours for PPD read. Vaccine Information Statement provided to patient.

## 2019-05-01 NOTE — Patient Instructions (Signed)
Patient presents for PPD placement for work. Denies previous positive TB test  Denies known exposure to TB   Tuberculin skin test applied to left ventral forearm.  Patient informed to return to [facility] in 48-72 hours for PPD read. Vaccine Information Statement provided to patient.

## 2019-05-09 ENCOUNTER — Encounter: Payer: Self-pay | Admitting: Family Medicine

## 2019-05-09 DIAGNOSIS — E1065 Type 1 diabetes mellitus with hyperglycemia: Secondary | ICD-10-CM | POA: Diagnosis not present

## 2019-05-09 DIAGNOSIS — E109 Type 1 diabetes mellitus without complications: Secondary | ICD-10-CM | POA: Diagnosis not present

## 2019-05-09 DIAGNOSIS — Z111 Encounter for screening for respiratory tuberculosis: Secondary | ICD-10-CM

## 2019-06-07 DIAGNOSIS — E0590 Thyrotoxicosis, unspecified without thyrotoxic crisis or storm: Secondary | ICD-10-CM | POA: Diagnosis not present

## 2019-06-07 DIAGNOSIS — Z794 Long term (current) use of insulin: Secondary | ICD-10-CM | POA: Diagnosis not present

## 2019-06-07 DIAGNOSIS — Z5181 Encounter for therapeutic drug level monitoring: Secondary | ICD-10-CM | POA: Diagnosis not present

## 2019-06-07 DIAGNOSIS — E1065 Type 1 diabetes mellitus with hyperglycemia: Secondary | ICD-10-CM | POA: Diagnosis not present

## 2019-06-07 DIAGNOSIS — E103213 Type 1 diabetes mellitus with mild nonproliferative diabetic retinopathy with macular edema, bilateral: Secondary | ICD-10-CM | POA: Diagnosis not present

## 2019-06-07 DIAGNOSIS — E0500 Thyrotoxicosis with diffuse goiter without thyrotoxic crisis or storm: Secondary | ICD-10-CM | POA: Diagnosis not present

## 2019-07-17 DIAGNOSIS — E109 Type 1 diabetes mellitus without complications: Secondary | ICD-10-CM | POA: Diagnosis not present

## 2019-07-17 DIAGNOSIS — E1065 Type 1 diabetes mellitus with hyperglycemia: Secondary | ICD-10-CM | POA: Diagnosis not present

## 2019-07-18 DIAGNOSIS — E109 Type 1 diabetes mellitus without complications: Secondary | ICD-10-CM | POA: Diagnosis not present

## 2019-07-18 DIAGNOSIS — E0590 Thyrotoxicosis, unspecified without thyrotoxic crisis or storm: Secondary | ICD-10-CM | POA: Diagnosis not present

## 2019-07-18 DIAGNOSIS — E0500 Thyrotoxicosis with diffuse goiter without thyrotoxic crisis or storm: Secondary | ICD-10-CM | POA: Diagnosis not present

## 2019-07-18 DIAGNOSIS — E1065 Type 1 diabetes mellitus with hyperglycemia: Secondary | ICD-10-CM | POA: Diagnosis not present

## 2019-08-07 DIAGNOSIS — E1065 Type 1 diabetes mellitus with hyperglycemia: Secondary | ICD-10-CM | POA: Diagnosis not present

## 2019-08-07 DIAGNOSIS — E109 Type 1 diabetes mellitus without complications: Secondary | ICD-10-CM | POA: Diagnosis not present

## 2019-10-13 ENCOUNTER — Encounter: Payer: Self-pay | Admitting: Family Medicine

## 2019-10-13 ENCOUNTER — Ambulatory Visit (INDEPENDENT_AMBULATORY_CARE_PROVIDER_SITE_OTHER): Payer: Managed Care, Other (non HMO)

## 2019-10-13 ENCOUNTER — Other Ambulatory Visit: Payer: Self-pay

## 2019-10-13 DIAGNOSIS — Z23 Encounter for immunization: Secondary | ICD-10-CM | POA: Diagnosis not present

## 2019-12-01 LAB — HEPATIC FUNCTION PANEL
ALT: 12 (ref 7–35)
AST: 15 (ref 13–35)
Alkaline Phosphatase: 45 (ref 25–125)
Bilirubin, Total: 1

## 2019-12-01 LAB — LIPID PANEL
Cholesterol: 170 (ref 0–200)
HDL: 73 — AB (ref 35–70)

## 2019-12-01 LAB — IRON,TIBC AND FERRITIN PANEL: Ferritin: 11.6

## 2019-12-01 LAB — MICROALBUMIN, URINE: Microalb, Ur: <0.7

## 2019-12-01 LAB — COMPREHENSIVE METABOLIC PANEL
Albumin: 4.2 (ref 3.5–5.0)
Calcium: 4.2 — AB (ref 8.7–10.7)
Calcium: 9.3 (ref 8.7–10.7)
GFR calc Af Amer: 98
GFR calc non Af Amer: 81

## 2019-12-01 LAB — BASIC METABOLIC PANEL
BUN: 9 (ref 4–21)
CO2: 28 — AB (ref 13–22)
Chloride: 106 (ref 99–108)
Creatinine: 0.8 (ref 0.5–1.1)
Glucose: 95
Glucose: 95
Potassium: 4.4 (ref 3.4–5.3)
Sodium: 142 (ref 137–147)

## 2019-12-01 LAB — HEMOGLOBIN A1C: Hemoglobin A1C: 7.8

## 2019-12-01 LAB — TESTOSTERONE: Testosterone: 28.8

## 2019-12-01 LAB — TSH: TSH: 0.51 (ref 0.41–5.90)

## 2020-02-08 NOTE — Progress Notes (Addendum)
Phone: [phone]   Subjective:  Patient presents today for their annual physical. Chief complaint-noted.   See problem oriented charting- Review of Systems  Constitutional: Negative for chills and fever.  HENT: Negative for hearing loss and tinnitus.   Eyes: Negative for blurred vision, double vision and photophobia.  Respiratory: Negative for cough, shortness of breath and wheezing.   Cardiovascular: Negative for chest pain, palpitations and leg swelling.  Gastrointestinal: Negative for constipation, heartburn, nausea and vomiting.  Genitourinary: Negative for dysuria, frequency and urgency.  Musculoskeletal: Negative for back pain, joint pain and neck pain.  Skin: Negative for itching and rash.  Neurological: Negative for dizziness and weakness.  Endo/Heme/Allergies: Does not bruise/bleed easily.  Psychiatric/Behavioral: Negative for depression, memory loss, substance abuse and suicidal ideas. The patient does not have insomnia.    The following were reviewed and entered/updated in epic: Past Medical History:  Diagnosis Date  . Diabetes mellitus without complication ([place])    diagnosed at 44 years old 53. Dr. Delrae Rend  . UTI (urinary tract infection)    Patient Active Problem List   Diagnosis Date Noted  . 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 Graves disease 02/07/2019    Priority: Low  . Adhesive capsulitis 12/03/2017    Priority: Low   Past Surgical History:  Procedure Laterality Date  . none      Family History  Problem Relation Age of Onset  . Hypertension Mother   . Arthritis Mother   . Hyperlipidemia Father   . Hypertension Father   . Atrial fibrillation Father        cardioversion and ablation.   . Breast cancer Paternal Grandmother   . Pancreatic cancer Paternal Grandfather   . Multiple myeloma Brother     Medications- reviewed and updated Current Outpatient Medications  Medication Sig  Dispense Refill  . atenolol (TENORMIN) 25 MG tablet     . insulin aspart protamine- aspart (NOVOLOG MIX 70/30) (70-30) 100 UNIT/ML injection Inject into the skin. Has pump    . methimazole (TAPAZOLE) 10 MG tablet      No current facility-administered medications for this visit.    Allergies-reviewed and updated Allergies  Allergen Reactions  . Meloxicam Other (See Comments)    Other Reaction: itchy    Social History   Social History Narrative   Married Sept 2018. Lives with husband. Mom keeps her yorkiepoo. Son is 45 years old in 2018. Patient's husband has disabled son born 52 who is nonverbal from seizure activity- requires full time care.       Bariatric Care with [facility] right now.    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    Objective  Objective:  BP 118/62   Pulse 68   Temp 97.7 F (36.5 C) (Temporal)   Ht 5' 10"  (1.778 m)   Wt 187 lb (84.8 kg)   LMP 01/20/2020   BMI 26.83 kg/m  Gen: NAD, resting comfortably HEENT: Mask not removed due to covid 19. TM normal. Bridge of nose normal. Eyelids normal.  Neck: no thyromegaly or cervical lymphadenopathy  CV: RRR no murmurs rubs or gallops Lungs: CTAB no crackles, wheeze, rhonchi Abdomen: soft/nontender/nondistended/normal bowel sounds. No  rebound or guarding.  Ext: no edema Skin: warm, dry Neuro: grossly normal, moves all extremities, PERRLA Blind swab inserted to test for vaginal discharge- no external discharge noted  Diabetic Foot Exam - Simple   Simple Foot Form Diabetic Foot exam was performed with the following findings: Yes 02/09/2020  9:22 AM  Visual Inspection No deformities, no ulcerations, no other skin breakdown bilaterally: Yes Sensation Testing Intact to touch and  monofilament testing bilaterally: Yes Pulse Check Posterior Tibialis and Dorsalis pulse intact bilaterally: Yes Comments       Assessment and Plan   44 y.o. female presenting for annual physical.  Health Maintenance counseling: 1. Anticipatory guidance: Patient counseled regarding regular dental exams q6 months, eye exams has not had this year. Followed by [facility]. Will call to make appointment. ,  avoiding smoking and second hand smoke , limiting alcohol to 1 beverage per day . 1 glass of wine 1-2 times a monthe   2. Risk factor reduction:  Advised patient of need for regular exercise and diet rich and fruits and vegetables to reduce risk of heart attack and stroke. Exercise- does not exercise but is very active at work- discussed adding this in- thinks doing with husband would help- feels limited by time. Diet-follows a healthy low carb diet. .  Wt Readings from Last 3 Encounters:  02/09/20 187 lb (84.8 kg)  02/07/19 184 lb 12.8 oz (83.8 kg)  12/13/18 193 lb (87.5 kg)  3. Immunizations/screenings/ancillary studies- fully up to date Immunization History  Administered Date(s) Administered  . Influenza,inj,Quad PF,6+ Mos 10/13/2019  . Influenza-Unspecified 09/27/2017, 10/07/2018  . PFIZER SARS-COV-2 Vaccination 12/30/2019, 01/20/2020  . PPD Test 01/05/2018, 04/12/2019, 04/29/2019  . Pneumococcal Polysaccharide-23 09/09/2016  . Tdap 02/07/2019   Health Maintenance Due  Topic Date Due  . FOOT EXAM - today  02/08/2020  . OPHTHALMOLOGY EXAM - advised to update diabetic eye exam- last 02/07/2019 02/08/2020  . URINE MICROALBUMIN  - today with labs 02/08/2020   4. Cervical cancer screening- Last pap done 01/28/2019. Next due 02/08/2024 due to negative HPV. Possible BV with some white discharge - will send for testing. monogomous so does not want STD check  5. Breast cancer screening-  breast exam declines and mammogram-yearly with the breast center- around 3 years at this point. Does self  exams. she will check with insurance and see where she can have done.  6. Colon cancer screening -discussed her colon cancer guidelines for [place] PTF and ACS suggesting starting at age 38-insurance does not always cover yet-  7. Skin cancer screening- has appointment in November with dermatology. advised regular sunscreen use. Denies worrisome, changing, or new skin lesions.  8. Birth control/STD check- No birth control needed in that  and monogamous. Has had 2 rounds of IVF in the past.   9. Osteoporosis screening at 73- not in age range  -never smoker  Status of chronic or acute concerns   # Diabetes type 1 with eye manifestations S:follows with Dr. Buddy Duty.  On insulin pump.  Had Pneumovax September 2017-we will try to get records.History of retinopathy with macular edema right eye. Last eye exam right had a year ago-encouraged follow-up.   Lab Results  Component Value Date   HGBA1C 7.8 12/01/2019   HGBA1C 8.6 (A) 02/07/2019   HGBA1C 8.1 04/23/2017  A/P: Improved control last check-encourage regular follow-up with endocrinology  Graves' disease- controlled on methimazole- just had TSH- will abstract in labs  Lab Results  Component Value Date  TSH 0.51 12/01/2019   Low hgb- she hesitates with iron due to constipation. Recommended trial of once or twice a week ferrous sulfate 325 mg.   Slight white discharge when urinates or has BM over last few weeks. No odor. BV and yeast- will check for these. Would lean toward yeast without odor. No abdominal pain  Recommended follow up: Return in about 1 year (around 02/08/2021) for physical or sooner if needed.  Lab/Order associations: fasting   ICD-10-CM   1. Preventative health care  Z00.00   2. 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 Graves disease  E05.00   4. Screen for colon cancer  Z12.11 Ambulatory referral to Gastroenterology  5.  Vaginal discharge  N89.8 Cervicovaginal ancillary only   Return precautions advised.  Garret Reddish, MD

## 2020-02-09 ENCOUNTER — Other Ambulatory Visit: Payer: Self-pay

## 2020-02-09 ENCOUNTER — Encounter: Payer: Self-pay | Admitting: Family Medicine

## 2020-02-09 ENCOUNTER — Other Ambulatory Visit (HOSPITAL_COMMUNITY)
Admission: RE | Admit: 2020-02-09 | Discharge: 2020-02-09 | Disposition: A | Discharge status: Home / Self Care | Payer: Managed Care, Other (non HMO) | Source: Ambulatory Visit | Attending: Family Medicine | Admitting: Family Medicine

## 2020-02-09 ENCOUNTER — Ambulatory Visit (INDEPENDENT_AMBULATORY_CARE_PROVIDER_SITE_OTHER): Payer: Managed Care, Other (non HMO) | Admitting: Family Medicine

## 2020-02-09 VITALS — BP 118/62 | HR 68 | Temp 97.7°F | Ht 70.0 in | Wt 187.0 lb

## 2020-02-09 DIAGNOSIS — E0500 Thyrotoxicosis with diffuse goiter without thyrotoxic crisis or storm: Secondary | ICD-10-CM

## 2020-02-09 DIAGNOSIS — Z0000 Encounter for general adult medical examination without abnormal findings: Secondary | ICD-10-CM

## 2020-02-09 DIAGNOSIS — Z1211 Encounter for screening for malignant neoplasm of colon: Secondary | ICD-10-CM | POA: Diagnosis not present

## 2020-02-09 DIAGNOSIS — E10311 Type 1 diabetes mellitus with unspecified diabetic retinopathy with macular edema: Secondary | ICD-10-CM

## 2020-02-09 DIAGNOSIS — N898 Other specified noninflammatory disorders of vagina: Secondary | ICD-10-CM | POA: Insufficient documentation

## 2020-02-09 LAB — TESTOSTERONE: Testosterone, total: 28.8

## 2020-02-09 LAB — THYROID STIMULATING HORMONE: TSI: 4.3

## 2020-02-09 LAB — TSH: TSH: 2.08

## 2020-02-09 LAB — T4, FREE
Free T4: 2.08
Free T4: 0.77

## 2020-02-09 LAB — PROLACTIN: Prolactin: 9.37

## 2020-02-09 NOTE — Patient Instructions (Addendum)
Health Maintenance Due  Topic Date Due  . OPHTHALMOLOGY EXAM-please get this scheduled 02/08/2020    Recommended trial of once or twice a week ferrous sulfate 325 mg  We will call you within two weeks about your referral to GI. If you do not hear within 3 weeks, give [place] a call.   Should have results by Monday about your swab for discharge  Please stop by lab before you go If you do not have mychart- we will call you about results within 5 business days of [place] receiving them.  If you have mychart- we will send your results within 3 business days of [place] receiving them.  If abnormal or we want to clarify a result, we will call or mychart you to make sure you receive the message.  If you have questions or concerns or don't hear within 5-7 days, please send [place] a message or call us.    Recommended follow up: Return in about 1 year (around 02/08/2021) for physical or sooner if needed.

## 2020-02-12 ENCOUNTER — Encounter: Payer: Self-pay | Admitting: Family Medicine

## 2020-02-12 LAB — CERVICOVAGINAL ANCILLARY ONLY
Bacterial Vaginitis (gardnerella): POSITIVE — AB
Candida Glabrata: NEGATIVE
Candida Vaginitis: NEGATIVE
Comment: NEGATIVE
Comment: NEGATIVE
Comment: NEGATIVE

## 2020-02-12 MED ORDER — METRONIDAZOLE 500 MG PO TABS: 500.0000 mg | ORAL_TABLET | Freq: Two times a day (BID) | ORAL | 0 refills | Status: DC

## 2020-02-12 NOTE — Progress Notes (Signed)
Bacterial vaginosis found on swab-I sent in metronidazole for you to take.  I know you already know this but do not drink alcohol while on metronidazole.  If you continue to have symptoms after treatment please let us know  No yeast was found.

## 2020-02-12 NOTE — Addendum Note (Signed)
Addended by: Marin Olp on: 02/12/2020 07:51 PM   Modules accepted: Orders

## 2020-02-13 ENCOUNTER — Other Ambulatory Visit: Payer: Self-pay

## 2020-02-13 MED ORDER — METRONIDAZOLE 500 MG PO TABS: 500.0000 mg | ORAL_TABLET | Freq: Two times a day (BID) | ORAL | 0 refills | Status: DC

## 2020-03-08 ENCOUNTER — Encounter: Payer: Self-pay | Admitting: Family Medicine

## 2020-03-08 ENCOUNTER — Ambulatory Visit (INDEPENDENT_AMBULATORY_CARE_PROVIDER_SITE_OTHER): Payer: Managed Care, Other (non HMO) | Admitting: Family Medicine

## 2020-03-08 VITALS — Ht 70.0 in | Wt 190.0 lb

## 2020-03-08 DIAGNOSIS — L700 Acne vulgaris: Secondary | ICD-10-CM

## 2020-03-08 DIAGNOSIS — L659 Nonscarring hair loss, unspecified: Secondary | ICD-10-CM | POA: Diagnosis not present

## 2020-03-08 DIAGNOSIS — L709 Acne, unspecified: Secondary | ICD-10-CM | POA: Insufficient documentation

## 2020-03-08 MED ORDER — ADAPALENE-BENZOYL PEROXIDE 0.1-2.5 % EX GEL: 1.0000 "application " | Freq: Every day | CUTANEOUS | 5 refills | Status: DC

## 2020-03-08 MED ORDER — DOXYCYCLINE HYCLATE 100 MG PO TABS: 100.0000 mg | ORAL_TABLET | Freq: Two times a day (BID) | ORAL | 2 refills | Status: DC

## 2020-03-08 NOTE — Progress Notes (Signed)
Phone [phone] Virtual visit via Video note   Subjective:  Chief complaint: Chief Complaint  Patient presents with  . virtual  . hair thinning  . Acne   This visit type was conducted due to national recommendations for restrictions regarding the COVID-19 Pandemic (e.g. social distancing).  This format is felt to be most appropriate for this patient at this time balancing risks to patient and risks to population by having him in for in person visit.  No physical exam was performed (except for noted visual exam or audio findings with Telehealth visits).    Our team/I connected with Penny Andrade at 10:00 AM EST by a video enabled telemedicine application (doxy.me or caregility through epic) and verified that I am speaking with the correct person using two identifiers.  Location patient: [facility] Location provider: [facility], office Persons participating in the virtual visit:  patient  Our team/I discussed the limitations of evaluation and management by telemedicine and the availability of in person appointments. In light of current covid-19 pandemic, patient also understands that we are trying to protect them by minimizing in office contact if at all possible.  The patient expressed consent for telemedicine visit and agreed to proceed. Patient understands insurance will be billed.   Past Medical History-  Patient Active Problem List   Diagnosis Date Noted  . DM (diabetes mellitus) type I controlled with eye manifestation ([place])     Priority: High  . Acne 03/08/2020    Priority: Medium  . Graves disease 02/07/2019    Priority: Medium  . Adhesive capsulitis 12/03/2017    Priority: Low    Medications- reviewed and updated Current Outpatient Medications  Medication Sig Dispense Refill  . atenolol (TENORMIN) 25 MG tablet     . insulin aspart protamine- aspart (NOVOLOG MIX 70/30) (70-30) 100 UNIT/ML injection Inject into the skin. Has pump    . methimazole (TAPAZOLE) 10  MG tablet      No current facility-administered medications for this visit.     Objective:  Ht 5' 10"  (1.778 m)   Wt 190 lb (86.2 kg)   BMI 27.26 kg/m  self reported vitals Gen: NAD, resting comfortably Lungs: nonlabored, normal respiratory rate  Skin: appears dry, multiple erythematous raised areas primarily in distribution of the mask on the face consistent with acne     Assessment and Plan   Hair Thinning S:pt c/o hair thinning for more than 6 months. She noticed this started when she had thyroid testing done and thyroid came back normal. Thinning at temple area and also has 2 spots further back in hairline Lab Results  Component Value Date   TSH 0.51 12/01/2019  A/P: Hair thinning does not appear to be thyroid related.  Patient has already scheduled visit with dermatology Dr. Leonie Green with [facility] in [place] will let me know if there are any changes but otherwise will keep this appointment and keep me updated on how things go.  Acne S: gettting worse, pt thinks mask can be making acne worse and she has tried a lot of OTC acne meds and has appointment with Derm in Nov (scheduled 2 months ago) for care as above but also to discuss acne.  Patient already discussed her concerns with Endocrinologist Dr. Satira Sark her for PCOS and patient reports was told no PCOS.  Patient is currently not on birth control and would be happy to get pregnant-Dr. Buddy Duty was Hesitant about spironolactone as a result.Patient reports she Has some hair growth on her  face- doing some tweezers.   She has tried-Clindamycin topical- old rx. Using [facility] system. Has tried black soap, aloe vera gel. Skin is very dry- sometimes gets really red and has tried triamcinolone. Left side of face cystic acne and painful  She has used doxycycline years ago-Nausea doxycyline in past better with eating with food.  She is aware of photosensitivity issues A/P: 44 year old female with acne  which seems to be triggered by consistent mask wearing which is required for work.  Given current COVID-19 pandemic I do not believe we can alter this. -I do believe she needs systemic therapy and we opted for doxycycline 100 mg twice daily and when symptoms improved can change to once daily.  3 months of therapy planned -She knows to stop doxycycline immediately if becomes pregnant we reviewed potential risks but still believe spironolactone was a greater risk -Ideally would also be on benzyl peroxide to avoid antibiotic resistance so we will trial a combo retinoid and benzyl peroxide product at night  Recommended follow up: Already has upcoming physical scheduled-otherwise as needed for acute concern-I did ask her to update me via MyChart on her progress with this Future Appointments  Date Time Provider [place]  02/14/2021  8:40 AM Marin Olp, MD [facility]    Lab/Order associations:   ICD-10-CM   1. Acne vulgaris  L70.0     Meds ordered this encounter  Medications  . Adapalene-Benzoyl Peroxide 0.1-2.5 % gel    Sig: Apply 1 application topically at bedtime.    Dispense:  45 g    Refill:  5  . doxycycline (VIBRA-TABS) 100 MG tablet    Sig: Take 1 tablet (100 mg total) by mouth 2 (two) times daily.    Dispense:  60 tablet    Refill:  2    Return precautions advised.  Garret Reddish, MD

## 2020-03-08 NOTE — Patient Instructions (Addendum)
Health Maintenance Due  Topic Date Due  . OPHTHALMOLOGY EXAM - got to schedule 02/08/2020  . URINE MICROALBUMIN -address at next OV 02/08/2020   Depression screen [facility] 2/9 02/09/2020 02/07/2019 07/14/2017  Decreased Interest 0 0 0  Down, Depressed, Hopeless 0 0 0  PHQ - 2 Score 0 0 0  Altered sleeping 0 - -  Tired, decreased energy 0 - -  Change in appetite 0 - -  Feeling bad or failure about yourself  0 - -  Trouble concentrating 0 - -  Moving slowly or fidgety/restless 0 - -  Suicidal thoughts 0 - -  PHQ-9 Score 0 - -

## 2020-03-12 ENCOUNTER — Telehealth: Payer: Self-pay

## 2020-03-12 NOTE — Telephone Encounter (Signed)
Pending approval for Adapalene-Benzoyl Peroxide Gel Key: [id number]  Rx #: [id number] ICD 10 code: L70.9 [place] Case ID: [id number]  I will update once a decision has been made.

## 2020-03-13 NOTE — Telephone Encounter (Signed)
Approval received  03/12/2020-03/12/2022 My chart message sent to patient

## 2020-03-29 ENCOUNTER — Encounter: Payer: Self-pay | Admitting: Family Medicine

## 2020-04-01 ENCOUNTER — Other Ambulatory Visit: Payer: Self-pay

## 2020-04-01 MED ORDER — FLUCONAZOLE 150 MG PO TABS: 150.0000 mg | ORAL_TABLET | Freq: Every day | ORAL | 0 refills | Status: DC

## 2020-04-08 ENCOUNTER — Encounter: Payer: Self-pay | Admitting: Family Medicine

## 2020-05-03 LAB — COMPREHENSIVE METABOLIC PANEL
Albumin: 4.2 (ref 3.5–5.0)
Calcium: 9.4 (ref 8.7–10.7)
GFR calc Af Amer: 101
GFR calc non Af Amer: 83

## 2020-05-03 LAB — HEPATIC FUNCTION PANEL
ALT: 29 (ref 7–35)
AST: 49 — AB (ref 13–35)
Alkaline Phosphatase: 49 (ref 25–125)
Bilirubin, Total: 0.7

## 2020-05-03 LAB — BASIC METABOLIC PANEL
BUN: 14 (ref 4–21)
CO2: 30 — AB (ref 13–22)
Chloride: 103 (ref 99–108)
Creatinine: 0.8 (ref 0.5–1.1)
Glucose: 143
Potassium: 4.3 (ref 3.4–5.3)
Sodium: 139 (ref 137–147)

## 2020-05-31 ENCOUNTER — Other Ambulatory Visit: Payer: Self-pay

## 2020-05-31 MED ORDER — DOXYCYCLINE HYCLATE 100 MG PO TABS: 100.0000 mg | ORAL_TABLET | Freq: Two times a day (BID) | ORAL | 2 refills | Status: DC

## 2020-06-01 ENCOUNTER — Other Ambulatory Visit: Payer: Self-pay

## 2020-06-01 ENCOUNTER — Encounter (HOSPITAL_COMMUNITY): Payer: Self-pay | Admitting: Emergency Medicine

## 2020-06-01 ENCOUNTER — Emergency Department (HOSPITAL_COMMUNITY)
Admission: EM | Admit: 2020-06-01 | Discharge: 2020-06-01 | Disposition: A | Discharge status: Home / Self Care | Payer: Managed Care, Other (non HMO) | Attending: Emergency Medicine | Admitting: Emergency Medicine

## 2020-06-01 ENCOUNTER — Emergency Department (HOSPITAL_COMMUNITY): Payer: Managed Care, Other (non HMO)

## 2020-06-01 DIAGNOSIS — Z79899 Other long term (current) drug therapy: Secondary | ICD-10-CM | POA: Insufficient documentation

## 2020-06-01 DIAGNOSIS — Z794 Long term (current) use of insulin: Secondary | ICD-10-CM | POA: Insufficient documentation

## 2020-06-01 DIAGNOSIS — E109 Type 1 diabetes mellitus without complications: Secondary | ICD-10-CM | POA: Insufficient documentation

## 2020-06-01 DIAGNOSIS — Z888 Allergy status to other drugs, medicaments and biological substances status: Secondary | ICD-10-CM | POA: Diagnosis not present

## 2020-06-01 DIAGNOSIS — R110 Nausea: Secondary | ICD-10-CM | POA: Diagnosis not present

## 2020-06-01 DIAGNOSIS — R1032 Left lower quadrant pain: Secondary | ICD-10-CM | POA: Diagnosis present

## 2020-06-01 LAB — URINALYSIS, ROUTINE W REFLEX MICROSCOPIC
Bilirubin Urine: NEGATIVE
Glucose, UA: 50 mg/dL — AB
Hgb urine dipstick: NEGATIVE
Ketones, ur: NEGATIVE mg/dL
Leukocytes,Ua: NEGATIVE
Nitrite: NEGATIVE
Protein, ur: NEGATIVE mg/dL
Specific Gravity, Urine: 1.024 (ref 1.005–1.030)
pH: 7 (ref 5.0–8.0)

## 2020-06-01 LAB — CBC WITH DIFFERENTIAL/PLATELET
Abs Immature Granulocytes: 0 10*3/uL (ref 0.00–0.07)
Basophils Absolute: 0 10*3/uL (ref 0.0–0.1)
Basophils Relative: 1 %
Eosinophils Absolute: 0.1 10*3/uL (ref 0.0–0.5)
Eosinophils Relative: 3 %
HCT: 33.4 % — ABNORMAL LOW (ref 36.0–46.0)
Hemoglobin: 10.5 g/dL — ABNORMAL LOW (ref 12.0–15.0)
Immature Granulocytes: 0 %
Lymphocytes Relative: 48 %
Lymphs Abs: 1.8 10*3/uL (ref 0.7–4.0)
MCH: 24.8 pg — ABNORMAL LOW (ref 26.0–34.0)
MCHC: 31.4 g/dL (ref 30.0–36.0)
MCV: 79 fL — ABNORMAL LOW (ref 80.0–100.0)
Monocytes Absolute: 0.4 10*3/uL (ref 0.1–1.0)
Monocytes Relative: 11 %
Neutro Abs: 1.4 10*3/uL — ABNORMAL LOW (ref 1.7–7.7)
Neutrophils Relative %: 37 %
Platelets: 225 10*3/uL (ref 150–400)
RBC: 4.23 MIL/uL (ref 3.87–5.11)
RDW: 13.4 % (ref 11.5–15.5)
WBC: 3.7 10*3/uL — ABNORMAL LOW (ref 4.0–10.5)
nRBC: 0 % (ref 0.0–0.2)

## 2020-06-01 LAB — BASIC METABOLIC PANEL
Anion gap: 9 (ref 5–15)
BUN: 11 mg/dL (ref 6–20)
CO2: 25 mmol/L (ref 22–32)
Calcium: 8.7 mg/dL — ABNORMAL LOW (ref 8.9–10.3)
Chloride: 102 mmol/L (ref 98–111)
Creatinine, Ser: 0.77 mg/dL (ref 0.44–1.00)
GFR calc Af Amer: >60 mL/min (ref >60)
GFR calc non Af Amer: >60 mL/min (ref >60)
Glucose, Bld: 202 mg/dL — ABNORMAL HIGH (ref 70–99)
Potassium: 3.5 mmol/L (ref 3.5–5.1)
Sodium: 136 mmol/L (ref 135–145)

## 2020-06-01 LAB — I-STAT BETA HCG BLOOD, ED (MC, WL, AP ONLY): I-stat hCG, quantitative: <5 m[IU]/mL (ref <5)

## 2020-06-01 MED ORDER — SENNOSIDES-DOCUSATE SODIUM 8.6-50 MG PO TABS
1.0000 | ORAL_TABLET | Freq: Every evening | ORAL | 0 refills | Status: DC | PRN
Start: 2020-06-01 — End: 2023-10-29

## 2020-06-01 MED ORDER — IOHEXOL 300 MG/ML  SOLN
75.0000 mL | Freq: Once | INTRAMUSCULAR | Status: AC | PRN
  Administered 2020-06-01: 75 mL via INTRAVENOUS

## 2020-06-01 NOTE — ED Provider Notes (Signed)
[facility] EMERGENCY DEPARTMENT Provider Note   CSN: [id number] Arrival date & time: 06/01/20  0556     History Chief Complaint  Patient presents with   Abdominal Pain    Penny Andrade is a 44 y.o. female.  Patient presents to the emergency department for evaluation of abdominal pain. Patient reports that she was awakened from sleep by severe, sharp and stabbing pain in the left lower abdomen. She reports that there was nausea but she did not vomit. No associated diarrhea or constipation. She has not noticed any urinary symptoms. Patient reports that she went to bed feeling well, pain suddenly woke her up from sleep. During transport by EMS pain has steadily improved.        Past Medical History:  Diagnosis Date   Diabetes mellitus without complication ([place])    diagnosed at 44 years old 76. Dr. Delrae Rend   UTI (urinary tract infection)     Patient Active Problem List   Diagnosis Date Noted   Acne 03/08/2020   Graves disease 02/07/2019   Adhesive capsulitis 12/03/2017   DM (diabetes mellitus) type I controlled with eye manifestation [facility])     Past Surgical History:  Procedure Laterality Date   none       OB History   No obstetric history on file.     Family History  Problem Relation Age of Onset   Hypertension Mother    Arthritis Mother    Hyperlipidemia Father    Hypertension Father    Atrial fibrillation Father        cardioversion and ablation.    Breast cancer Paternal Grandmother    Pancreatic cancer Paternal Grandfather    Multiple myeloma Brother     Social History   Tobacco Use   Smoking status: Never Smoker   Smokeless tobacco: Never Used  Substance Use Topics   Alcohol use: Yes    Comment: drinks on average once weekly   Drug use: No    Home Medications Prior to Admission medications   Medication Sig Start Date End Date Taking? Authorizing Provider  Adapalene-Benzoyl Peroxide 0.1-2.5 % gel Apply 1  application topically at bedtime. 03/08/20   Marin Olp, MD  atenolol (TENORMIN) 25 MG tablet  12/19/18   [provider]  doxycycline (VIBRA-TABS) 100 MG tablet Take 1 tablet (100 mg total) by mouth 2 (two) times daily. 05/31/20 08/29/20  Marin Olp, MD  fluconazole (DIFLUCAN) 150 MG tablet Take 1 tablet (150 mg total) by mouth daily. 04/01/20   Marin Olp, MD  insulin aspart protamine- aspart (NOVOLOG MIX 70/30) (70-30) 100 UNIT/ML injection Inject into the skin. Has pump    [provider]  methimazole (TAPAZOLE) 10 MG tablet  12/23/18   [provider]    Allergies    Meloxicam  Review of Systems   Review of Systems  Gastrointestinal: Positive for abdominal pain and nausea.  All other systems reviewed and are negative.   Physical Exam Updated Vital Signs BP 115/66 (BP Location: Left Arm)    Pulse 85    Temp 98.5 F (36.9 C) (Oral)    Resp 18    Ht 5' 11"  (1.803 m)    Wt 88.5 kg    LMP 05/23/2020    SpO2 100%    BMI 27.20 kg/m   Physical Exam Vitals and nursing note reviewed.  Constitutional:      General: She is not in acute distress.    Appearance: Normal appearance.  She is well-developed.  HENT:     Head: Normocephalic and atraumatic.     Right Ear: Hearing normal.     Left Ear: Hearing normal.     Nose: Nose normal.  Eyes:     Conjunctiva/sclera: Conjunctivae normal.     Pupils: Pupils are equal, round, and reactive to light.  Cardiovascular:     Rate and Rhythm: Regular rhythm.     Heart sounds: S1 normal and S2 normal. No murmur. No friction rub. No gallop.   Pulmonary:     Effort: Pulmonary effort is normal. No respiratory distress.     Breath sounds: Normal breath sounds.  Chest:     Chest wall: No tenderness.  Abdominal:     General: Bowel sounds are normal.     Palpations: Abdomen is soft.     Tenderness: There is abdominal tenderness in the left lower quadrant. There is no guarding or rebound. Negative signs include  Murphy's sign and McBurney's sign.     Hernia: No hernia is present.  Musculoskeletal:        General: Normal range of motion.     Cervical back: Normal range of motion and neck supple.  Skin:    General: Skin is warm and dry.     Findings: No rash.  Neurological:     Mental Status: She is alert and oriented to person, place, and time.     GCS: GCS eye subscore is 4. GCS verbal subscore is 5. GCS motor subscore is 6.     Cranial Nerves: No cranial nerve deficit.     Sensory: No sensory deficit.     Coordination: Coordination normal.  Psychiatric:        Speech: Speech normal.        Behavior: Behavior normal.        Thought Content: Thought content normal.     ED Results / Procedures / Treatments   Labs (all labs ordered are listed, but only abnormal results are displayed) Labs Reviewed  CBC WITH DIFFERENTIAL/PLATELET - Abnormal; Notable for the following components:      Result Value   WBC 3.7 (*)    Hemoglobin 10.5 (*)    HCT 33.4 (*)    MCV 79.0 (*)    MCH 24.8 (*)    Neutro Abs 1.4 (*)    All other components within normal limits  BASIC METABOLIC PANEL - Abnormal; Notable for the following components:   Glucose, Bld 202 (*)    Calcium 8.7 (*)    All other components within normal limits  URINALYSIS, ROUTINE W REFLEX MICROSCOPIC  I-STAT BETA HCG BLOOD, ED ([facility], [facility], [facility] ONLY)    EKG None  Radiology No results found.  Procedures Procedures (including critical care time)  Medications Ordered in ED Medications  iohexol (OMNIPAQUE) 300 MG/ML solution 75 mL (75 mLs Intravenous Contrast Given 06/01/20 3846)    ED Course  I have reviewed the triage vital signs and the nursing notes.  Pertinent labs & imaging results that were available during my care of the patient were reviewed by me and considered in my medical decision making (see chart for details).    MDM Rules/Calculators/A&P                      Patient presents with sudden onset left lower quadrant  abdominal pain that has significantly improved without intervention.  No urinary symptoms.  No history of kidney stones.  No pelvic or gynecologic  complaints.  She has not had similar symptoms previously.  Blood work is unremarkable.  Awaiting urinalysis.  Will perform CT scan to evaluate for possible diverticulitis or ureterolithiasis.  Less likely would be ovarian etiology.  Doubt torsion as her pain has resolved.  Will sign out to oncoming ER physician to follow-up on CT scan.  Final Clinical Impression(s) / ED Diagnoses Final diagnoses:  Left lower quadrant abdominal pain    Rx / DC Orders ED Discharge Orders    None       Desera Graffeo, Gwenyth Allegra, MD 06/01/20 [phone]

## 2020-06-01 NOTE — Discharge Instructions (Signed)
You were seen in the emergency department today with abdominal pain.  Your CT scan showed some constipation, a fibroid and some haziness over the cervix.  I recommend that you call your OB/GYN first thing on Monday to schedule the next available follow-up appointment for follow-up GYN exam.  If you develop return of your abdominal pain or sudden worsening symptoms you should return to the emergency department immediately for reevaluation.

## 2020-06-01 NOTE — ED Provider Notes (Signed)
Blood pressure 115/66, pulse 85, temperature 98.5 F (36.9 C), temperature source Oral, resp. rate 18, height 5' 11"  (1.803 m), weight 88.5 kg, last menstrual period 05/23/2020, SpO2 100 %.  Assuming care from Dr. Betsey Holiday.  In short, Penny Andrade is a 44 y.o. female with a chief complaint of Abdominal Pain .  Refer to the original H&P for additional details.  The current plan of care is to follow up CT a/p and reassess.  08:12 AM  CT imaging reviewed with no acute findings.  Patient does have a uterine fibroid, constipation, haziness over the cervix.  Discussed performing a pelvic exam here but patient is not having any pelvic symptoms such as discharge or bleeding.  She would like to defer that exam to her OB/GYN who she plans to call first thing on Monday to schedule a very close follow-up appointment for GYN exam.  Discussed intermittent torsion is a possibility but my suspicion for this is very low.  Her pain has resolved.  The ovaries on CT are normal size.  I do not see an indication for emergent ultrasound at this time.  Discussed ED return precautions in detail.    Margette Fast, MD 06/01/20 [phone]

## 2020-06-01 NOTE — ED Triage Notes (Addendum)
Pt c/o lower abd/pelvic pain that started suddenly this morning. Pt states that she tried to have a BM and had a syncopal episode. Pt states that when she came too she was flat on her face and her front 2 teeth are pushed in.

## 2020-08-05 ENCOUNTER — Telehealth: Payer: Self-pay | Admitting: Nutrition

## 2020-08-05 NOTE — Telephone Encounter (Signed)
Appointment scheduled for Dexcom training on 8/25

## 2020-08-21 ENCOUNTER — Encounter: Payer: Managed Care, Other (non HMO) | Attending: Internal Medicine | Admitting: Nutrition

## 2020-08-21 ENCOUNTER — Other Ambulatory Visit: Payer: Self-pay

## 2020-08-21 DIAGNOSIS — E109 Type 1 diabetes mellitus without complications: Secondary | ICD-10-CM | POA: Insufficient documentation

## 2020-08-21 NOTE — Progress Notes (Signed)
Penny Andrade was identified by name and DOB.  She is here today to start on the Dexcom G6 sensor.  She is currently wearing a Medtronic pump and tests her blood sugars ac and HS.  We discussed the difference between sensor reading and blood sugar readings and she reported good understanding of this.  she was given a sample of the Dexcom G6:  [id number]  Exp.3/22, and she applied it to her right upper abdomen.  We connected it to her phone and she had no final questions.

## 2020-08-23 ENCOUNTER — Other Ambulatory Visit: Payer: Self-pay | Admitting: Family Medicine

## 2020-08-23 NOTE — Telephone Encounter (Signed)
Please check with her on the acne-if she has had significant improvement I would like to change the doxycycline to once a day #90 with no refills.  May refill the gel as well

## 2020-08-23 NOTE — Telephone Encounter (Signed)
Pt requesting refills.

## 2020-10-09 ENCOUNTER — Telehealth: Payer: Self-pay

## 2020-10-09 NOTE — Telephone Encounter (Signed)
Pt would like to be seen virtually for reflux flare up, pt is only available to do a 4:40 visit due to her NP schedule. Pt would like to be seen on 10/22/20 at 4:40, can this slot be opened?

## 2020-10-09 NOTE — Telephone Encounter (Signed)
Is there another day that works- that day is very busy in afternoon and one of my block slots was already removed. Im open to 4 40 visit for her typically given her schedule.

## 2020-10-10 NOTE — Telephone Encounter (Signed)
Patient has been scheduled

## 2020-10-10 NOTE — Telephone Encounter (Signed)
Spoke with pt and she will do the SDA at 3:40 on 10/29/2020 if you guys could put her in that slot please.

## 2020-10-25 NOTE — Progress Notes (Signed)
Phone [phone] Virtual visit via Video note   Subjective:  Chief complaint: Chief Complaint  Patient presents with  . Gastroesophageal Reflux   This visit type was conducted due to national recommendations for restrictions regarding the COVID-19 Pandemic (e.g. social distancing).  This format is felt to be most appropriate for this patient at this time balancing risks to patient and risks to population by having him in for in person visit.  No physical exam was performed (except for noted visual exam or audio findings with Telehealth visits).    Our team/I connected with Penny Andrade at  3:40 PM EDT by a video enabled telemedicine application (doxy.me or caregility through epic) and verified that I am speaking with the correct person using two identifiers.  Location patient: [facility] Location provider: [facility], office Persons participating in the virtual visit:  patient  Our team/I discussed the limitations of evaluation and management by telemedicine and the availability of in person appointments. In light of current covid-19 pandemic, patient also understands that we are trying to protect them by minimizing in office contact if at all possible.  The patient expressed consent for telemedicine visit and agreed to proceed. Patient understands insurance will be billed.   Past Medical History-  Patient Active Problem List   Diagnosis Date Noted  . DM (diabetes mellitus) type I controlled with eye manifestation ([place])     Priority: High  . Acne 03/08/2020    Priority: Medium  . Graves disease 02/07/2019    Priority: Medium  . Adhesive capsulitis 12/03/2017    Priority: Low  . GERD (gastroesophageal reflux disease) 10/29/2020    Medications- reviewed and updated Current Outpatient Medications  Medication Sig Dispense Refill  . Adapalene-Benzoyl Peroxide 0.1-2.5 % gel APPLY EXTERNALLY TO THE AFFECTED AREA AT BEDTIME 45 g 5  . atenolol (TENORMIN) 25 MG tablet Take 25  mg by mouth daily.     Marland Kitchen atorvastatin (LIPITOR) 10 MG tablet Take 10 mg by mouth daily.    Marland Kitchen doxycycline (VIBRA-TABS) 100 MG tablet Take 1 tablet (100 mg total) by mouth daily. 90 tablet 0  . insulin aspart protamine- aspart (NOVOLOG MIX 70/30) (70-30) 100 UNIT/ML injection Inject into the skin. Has pump    . methimazole (TAPAZOLE) 10 MG tablet Take 5 mg by mouth daily.     Marland Kitchen senna-docusate (SENOKOT-S) 8.6-50 MG tablet Take 1 tablet by mouth at bedtime as needed for mild constipation or moderate constipation. 30 tablet 0  . pantoprazole (PROTONIX) 40 MG tablet Take 1 tablet (40 mg total) by mouth daily. 30 tablet 3   No current facility-administered medications for this visit.     Objective:  no self reported vitals. Reports normal vitals with Dr. Buddy Duty Gen: NAD, resting comfortably Lungs: nonlabored, normal respiratory rate  Skin: appears dry, no obvious rash     Assessment and Plan   #GERD #Fatigue/history of anemia S:Patient mentioned that she started to have a flare up 3 weeks ago. She stated that she had severe chest pain and heartburn which caused her to wake up that night- got up and changed positions and that helped some but didn't go away completely. Mild chronic issues with perhaps mild dry cough and thought hand baseline issues. She has taken otc tagamet, prilosec, mylanta, tums - eased up and never went away. Mom has had good success with protonix- she took some of her moms 3m and that really helped (at first was so nervous was taking prilosec and protonix 425m. She  missed one day and had a cough and some discomfort- mylanta dose at office got her through the day.  Never had exertional element to pain.   Has stopped eating before bed and watching spicy foods and that helps some and tries to space out dinner before laying down.   No unintentional weight loss, no shortness of breath though perhaps mild dyspnea on exertion without chest pain. Has had some fatigue.  Was told by  Dr. Cindra Eves office that anemia had worsened and patient needed to follow-up with me.  She still with irregular menstruation- remains very heavy when has menstruation and has more abdominal cramping with the menstruation. No blood in stool or dark black stool. Not consistently taking iron due to constipation.  A/P: Patient appears to have a flareup of GERD-resolution of symptoms with Protonix 5m (used her mothers and she knows this is not ideal).  Regardless the Protonix really seem to work-send in a prescription for her to use for 3 to 4 weeks and then update me via MyChart-if she continues to do well likely stepdown to Protonix 20 mg-if continues to do well with that may consider Pepcid  In regards to her anemia-she is going to try to get [place] [facility].  She has had some fatigue and perhaps mildly winded at times.  I do not think chest pain that resolved with PPI along with dyspnea on exertion while anemic likely signals coronary artery disease-instead we will treat with iron once a week as she has had constipation issues in the past.  Can recheck with next office visit.  Discussed possible colonoscopy but patient states her insurance right now would not cover this well and she would like to hold off unless visibly notes blood in stool or worsening anemia issues despite iron.  She may reconsider next year.  You do have follow-up in February  Recommended follow up: Keep February visit Future Appointments  Date Time Provider [place] 02/14/2021  8:40 AM HMarin Olp MD [facility]    Lab/Order associations:   ICD-10-CM   1. Gastroesophageal reflux disease, unspecified whether esophagitis present  K21.9   2. Fatigue, unspecified type  R53.83   3. Iron deficiency anemia due to chronic blood loss  D50.0     Meds ordered this encounter  Medications  . pantoprazole (PROTONIX) 40 MG tablet    Sig: Take 1 tablet (40 mg total) by mouth daily.    Dispense:   30 tablet    Refill:  3     Return precautions advised.  SGarret Reddish MD

## 2020-10-25 NOTE — Patient Instructions (Addendum)
Health Maintenance Due  Topic Date Due  . Hepatitis C Screening  Never done  . URINE MICROALBUMIN - request from Dr. Buddy Duty 02/08/2020  . HEMOGLOBIN A1C - request from Dr. Buddy Duty 05/31/2020   Try protonix 50m for 3-4 weeks and if does well can send in 231m Try iron once a week. Try to get records from Dr. KeBuddy Dutyn anemia.

## 2020-10-29 ENCOUNTER — Telehealth (INDEPENDENT_AMBULATORY_CARE_PROVIDER_SITE_OTHER): Payer: BC Managed Care – PPO | Admitting: Family Medicine

## 2020-10-29 ENCOUNTER — Encounter: Payer: Self-pay | Admitting: Family Medicine

## 2020-10-29 DIAGNOSIS — D500 Iron deficiency anemia secondary to blood loss (chronic): Secondary | ICD-10-CM

## 2020-10-29 DIAGNOSIS — K219 Gastro-esophageal reflux disease without esophagitis: Secondary | ICD-10-CM

## 2020-10-29 DIAGNOSIS — R5383 Other fatigue: Secondary | ICD-10-CM

## 2020-10-29 MED ORDER — PANTOPRAZOLE SODIUM 40 MG PO TBEC
40.0000 mg | DELAYED_RELEASE_TABLET | Freq: Every day | ORAL | 3 refills | Status: DC
Start: 2020-10-29 — End: 2024-08-13

## 2020-10-29 NOTE — Assessment & Plan Note (Signed)
#  GERD #Fatigue/history of anemia S:Patient mentioned that she started to have a flare up 3 weeks ago. She stated that she had severe chest pain and heartburn which caused her to wake up that night- got up and changed positions and that helped some but didn't go away completely. Mild chronic issues with perhaps mild dry cough and thought hand baseline issues. She has taken otc tagamet, prilosec, mylanta, tums - eased up and never went away. Mom has had good success with protonix- she took some of her moms 54m and that really helped (at first was so nervous was taking prilosec and protonix 41m. She missed one day and had a cough and some discomfort- mylanta dose at office got her through the day.  Never had exertional element to pain.   Has stopped eating before bed and watching spicy foods and that helps some and tries to space out dinner before laying down.   No unintentional weight loss, no shortness of breath though perhaps mild dyspnea on exertion without chest pain. Has had some fatigue.  Was told by Dr. KeCindra Evesffice that anemia had worsened and patient needed to follow-up with me.  She still with irregular menstruation- remains very heavy when has menstruation and has more abdominal cramping with the menstruation. No blood in stool or dark black stool. Not consistently taking iron due to constipation.  A/P: Patient appears to have a flareup of GERD-resolution of symptoms with Protonix 4035mused her mothers and she knows this is not ideal).  Regardless the Protonix really seem to work-send in a prescription for her to use for 3 to 4 weeks and then update me via MyChart-if she continues to do well likely stepdown to Protonix 20 mg-if continues to do well with that may consider Pepcid  In regards to her anemia-she is going to try to get [place] of [facility] labs [facility].  She has had some fatigue and perhaps mildly winded at times.  I do not think chest pain that resolved with PPI along  with dyspnea on exertion while anemic likely signals coronary artery disease-instead we will treat with iron once a week as she has had constipation issues in the past.  Can recheck with next office visit.  Discussed possible colonoscopy but patient states her insurance right now would not cover this well and she would like to hold off unless visibly notes blood in stool or worsening anemia issues despite iron.  She may reconsider next year.  You do have follow-up in February

## 2020-11-01 ENCOUNTER — Ambulatory Visit: Payer: BC Managed Care – PPO | Attending: Family

## 2020-11-12 ENCOUNTER — Encounter: Payer: Self-pay | Admitting: Family Medicine

## 2020-12-03 ENCOUNTER — Other Ambulatory Visit: Payer: Self-pay | Admitting: Family Medicine

## 2020-12-03 MED ORDER — DOXYCYCLINE HYCLATE 100 MG PO TABS: 100.0000 mg | ORAL_TABLET | Freq: Every day | ORAL | 3 refills | Status: DC

## 2021-01-20 NOTE — Progress Notes (Signed)
   Covid-19 Vaccination Clinic  Name:  SKYLEE BAIRD    MRN: [id number] DOB: 1976/03/28  01/20/2021  Ms. Leath-Warren was observed post Covid-19 immunization for 15 minutes without incident. She was provided with Vaccine Information Sheet and instruction to access the V-Safe system.   Ms. Andrey Farmer was instructed to call 911 with any severe reactions post vaccine: Marland Kitchen Difficulty breathing  . Swelling of face and throat  . A fast heartbeat  . A bad rash all over body  . Dizziness and weakness   Immunizations Administered    Name Date Dose VIS Date Route   Moderna Covid-19 Booster Vaccine 11/01/2020  8:30 AM 0.25 mL 10/16/2020 Intramuscular   Manufacturer: Moderna   Lot: [id number]   Page: 69794-801-65

## 2021-02-11 ENCOUNTER — Telehealth: Payer: Self-pay

## 2021-02-11 NOTE — Telephone Encounter (Signed)
13th fine at 12 20

## 2021-02-12 NOTE — Telephone Encounter (Signed)
See below, please r/s pt CPE to 04/13.

## 2021-02-12 NOTE — Telephone Encounter (Signed)
Patient has been r/s

## 2021-02-14 ENCOUNTER — Encounter: Payer: Managed Care, Other (non HMO) | Admitting: Family Medicine

## 2021-03-20 LAB — HEMOGLOBIN A1C: Hemoglobin A1C: 8.4

## 2021-04-08 NOTE — Progress Notes (Signed)
Phone [phone]   Subjective:  Patient presents today for their annual physical. Chief complaint-noted.   See problem oriented charting- ROS- full  review of systems was completed and negative with full ROS sheet completed/reviewed  The following were reviewed and entered/updated in epic: Past Medical History:  Diagnosis Date  . Alopecia    CCCA  . Diabetes mellitus without complication ([place])    diagnosed at 45 years old 13. Dr. Delrae Rend  . UTI (urinary tract infection)    Patient Active Problem List   Diagnosis Date Noted  . 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 Acne 03/08/2020    Priority: Medium  . Graves disease 02/07/2019    Priority: Medium  . Adhesive capsulitis 12/03/2017    Priority: Low  . GERD (gastroesophageal reflux disease) 10/29/2020   Past Surgical History:  Procedure Laterality Date  . none      Family History  Problem Relation Age of Onset  . Hypertension Mother   . Arthritis Mother   . Hyperlipidemia Father   . Hypertension Father   . Atrial fibrillation Father        cardioversion and ablation.   . Breast cancer Paternal Grandmother   . Pancreatic cancer Paternal Grandfather   . Multiple myeloma Brother     Medications- reviewed and updated Current Outpatient Medications  Medication Sig Dispense Refill  . Adapalene-Benzoyl Peroxide 0.1-2.5 % gel APPLY EXTERNALLY TO THE AFFECTED AREA AT BEDTIME 45 g 5  . atenolol (TENORMIN) 25 MG tablet Take 25 mg by mouth daily.     Marland Kitchen atorvastatin (LIPITOR) 10 MG tablet Take 10 mg by mouth daily.    Marland Kitchen doxycycline (VIBRA-TABS) 100 MG tablet Take 1 tablet (100 mg total) by mouth daily. 90 tablet 3  . insulin aspart protamine- aspart (NOVOLOG MIX 70/30) (70-30) 100 UNIT/ML injection Inject into the skin. Has pump    . methimazole (TAPAZOLE) 10 MG tablet Take 5 mg by mouth daily.     Marland Kitchen MINOXIDIL PO Take 2.5 mg by mouth. 1/2 tablet daily    .  pantoprazole (PROTONIX) 40 MG tablet Take 1 tablet (40 mg total) by mouth daily. 30 tablet 3  . senna-docusate (SENOKOT-S) 8.6-50 MG tablet Take 1 tablet by mouth at bedtime as needed for mild constipation or moderate constipation. 30 tablet 0   No current facility-administered medications for this visit.    Allergies-reviewed and updated Allergies  Allergen Reactions  . Meloxicam Other (See Comments)    Other Reaction: itchy    Social History   Social History Narrative   Married Sept 2018. Lives with husband. Mom keeps her yorkiepoo. Son is 71 years old in 2018. Patient's husband has disabled son born 74 who is nonverbal from seizure activity- requires full time care.       At [employer] student health started September 2021   Prior Bariatric Care with [facility] right now.    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    Objective  Objective:  BP 134/86   Pulse 74   Temp (!) 97.2 F (36.2 C) (Temporal)   Ht 5' 10"  (1.778 m)   Wt 197 lb 3.2 oz (89.4 kg)  LMP 04/07/2021 (Exact Date)   SpO2 98%   BMI 28.30 kg/m  Gen: NAD, resting comfortably HEENT: Mucous membranes are moist. Oropharynx normal. Removed cerumen both ears- TM normal Neck: stable thyromegaly CV: RRR no murmurs rubs or gallops Lungs: CTAB no crackles, wheeze, rhonchi Abdomen: soft/nontender/nondistended/normal bowel sounds. No rebound or guarding.  Ext: no edema Skin: warm, dry Neuro: grossly normal, moves all extremities, PERRLA  Diabetic Foot Exam - Simple   Simple Foot Form Diabetic Foot exam was performed with the following findings: Yes 04/09/2021 12:08 PM  Visual Inspection No deformities, no ulcerations, no other skin breakdown bilaterally: Yes Sensation Testing Intact to touch and  monofilament testing bilaterally: Yes Pulse Check Posterior Tibialis and Dorsalis pulse intact bilaterally: Yes Comments      Assessment and Plan   45 y.o. female presenting for annual physical.  Health Maintenance counseling: 1. Anticipatory guidance: Patient counseled regarding regular dental exams -q6 months, eye exams - yearly on file- [facility],  avoiding smoking and second hand smoke , limiting alcohol to 1 beverage per day, no illicit drugs .   2. Risk factor reduction:  Advised patient of need for regular exercise and diet rich and fruits and vegetables to reduce risk of heart attack and stroke. Exercise- walking new dog some- plans to increase that some if hours pull back some. Diet-Weight up 10 lbs from last CPE, working on increasing veggie intake- cutting back on creamers in coffee Wt Readings from Last 3 Encounters:  04/09/21 197 lb 3.2 oz (89.4 kg)  06/01/20 195 lb (88.5 kg)  03/08/20 190 lb (86.2 kg)  3. Immunizations/screenings/ancillary studies- hcv screen with labs today  Immunization History  Administered Date(s) Administered  . Influenza,inj,Quad PF,6+ Mos 10/13/2019, 10/04/2020  . Influenza-Unspecified 09/27/2017, 10/07/2018  . Moderna SARS-COV2 Booster Vaccination 11/01/2020  . PFIZER(Purple Top)SARS-COV-2 Vaccination 12/30/2019, 01/20/2020  . PPD Test 01/05/2018, 04/12/2019, 04/29/2019, 05/22/2020  . Pneumococcal Polysaccharide-23 09/09/2016  . Tdap 02/07/2019  4. Cervical cancer screening- 02/07/2019 with 5 year repeat with negative HPV 5. Breast cancer screening-  breast exam declines, prefers self exams,  and mammogram - 4 years out at this point 6. Colon cancer screening -  discussed newer guidelines and GI refer - has been referred but holding off until meets deductible 7. Skin cancer screening- saw dermatology November 2021- hair only. advised regular sunscreen use. Denies worrisome, changing, or new skin lesions.  8. Birth control/STD check-  no birth  control/fertility issues. No concern for STDs. Thinks perimenopausal  9. 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 Buddy Duty S: Medication:Novolog/insulin pump- on omnidash pod and on since january  A/P: controlled on last check- we will request labs and continue current meds  #Graves' disease-controlled on methimazol and atenolol   Lab Results  Component Value Date   TSH 0.51 12/01/2019   #Low hemoglobin/anemia-struggles with iron due to constipation-last year we opted to try once or twice a week instead- able to tolerate better  #  acne- still doing adapalene-benzoyl peroxide at bedtime. Dr. Bernerd Limbo reviewed and thought daily was reasonable- also helps with scalp inflammation. Not getting sun sensitivity with it on once a day  # GERD S:Medication: Pantoprazole 61m - cut back to just as needed- may not even be once a week A/P: doing well with sparing use- continue current meds. Can reduce if needed (she declines for now)    #hyperlipidemia S: Medication:atorvastatin 10 mg   Lab Results  Component Value Date   CHOL 170 12/01/2019   HDL 73 (A) 12/01/2019   LDLCALC 87 02/07/2019   TRIG 103.0 02/07/2019   CHOLHDL 3 02/07/2019   A/P: hopefully remains controlle-d update lipid panel today  Recommended follow up: Return in about 1 year (around 04/09/2022) for physical or sooner if needed.  Lab/Order associations: fasting   ICD-10-CM   1. Preventative health care  Z00.00 CBC with  Differential/Platelet    Comprehensive metabolic panel    Lipid panel    Microalbumin / creatinine urine ratio    Hepatitis C antibody    MM Digital Screening  2. Gastroesophageal reflux disease, unspecified whether esophagitis present  K21.9   3. 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 Encounter for hepatitis C screening test for low risk patient  Z11.59 Hepatitis C antibody  5. Screen for colon cancer  Z12.11   6. Encounter for screening mammogram for malignant neoplasm of breast  Z12.31 MM Digital Screening   No orders of the defined types were placed in this encounter.   Return precautions advised.  SGarret Reddish MD

## 2021-04-08 NOTE — Patient Instructions (Addendum)
Please stop by lab before you go If you have mychart- we will send your results within 3 business days of [place] receiving them.  If you do not have mychart- we will call you about results within 5 business days of [place] receiving them.  *please also note that you will see labs on mychart as soon as they post. I will later go in and write notes on them- will say "notes from Dr. Yong Channel"  We will call you within two weeks about your referral to GI. If you do not hear within 2 weeks, give [place] a call.   [place] Schedule an appointment by calling [phone].  Health Maintenance Due  Topic Date Due  . Hepatitis C Screening Done in office today.  Never done  . COLONOSCOPY (Pts 45-23yr Insurance coverage will need to be confirmed) holding off until deductible met. Please call GI or [place] ready Never done  . HEMOGLOBIN A1C  Sign release of information at the check out desk for last a1c and office note from Dr. KBuddy Duty 05/31/2020  . URINE MICROALBUMIN - done in office today.  11/30/2020    Recommended follow up: Return in about 1 year (around 04/09/2022) for physical or sooner if needed.

## 2021-04-09 ENCOUNTER — Other Ambulatory Visit: Payer: Self-pay

## 2021-04-09 ENCOUNTER — Encounter: Payer: Self-pay | Admitting: Family Medicine

## 2021-04-09 ENCOUNTER — Ambulatory Visit (INDEPENDENT_AMBULATORY_CARE_PROVIDER_SITE_OTHER): Payer: BC Managed Care – PPO | Admitting: Family Medicine

## 2021-04-09 VITALS — BP 134/86 | HR 74 | Temp 97.2°F | Ht 70.0 in | Wt 197.2 lb

## 2021-04-09 DIAGNOSIS — E10311 Type 1 diabetes mellitus with unspecified diabetic retinopathy with macular edema: Secondary | ICD-10-CM

## 2021-04-09 DIAGNOSIS — K219 Gastro-esophageal reflux disease without esophagitis: Secondary | ICD-10-CM

## 2021-04-09 DIAGNOSIS — Z1231 Encounter for screening mammogram for malignant neoplasm of breast: Secondary | ICD-10-CM

## 2021-04-09 DIAGNOSIS — E785 Hyperlipidemia, unspecified: Secondary | ICD-10-CM | POA: Diagnosis not present

## 2021-04-09 DIAGNOSIS — Z0000 Encounter for general adult medical examination without abnormal findings: Secondary | ICD-10-CM

## 2021-04-09 DIAGNOSIS — Z1211 Encounter for screening for malignant neoplasm of colon: Secondary | ICD-10-CM

## 2021-04-09 DIAGNOSIS — Z1159 Encounter for screening for other viral diseases: Secondary | ICD-10-CM

## 2021-04-09 LAB — COMPREHENSIVE METABOLIC PANEL
ALT: 11 U/L (ref 0–35)
AST: 15 U/L (ref 0–37)
Albumin: 3.8 g/dL (ref 3.5–5.2)
Alkaline Phosphatase: 51 U/L (ref 39–117)
BUN: 8 mg/dL (ref 6–23)
CO2: 29 mEq/L (ref 19–32)
Calcium: 8.9 mg/dL (ref 8.4–10.5)
Chloride: 105 mEq/L (ref 96–112)
Creatinine, Ser: 0.69 mg/dL (ref 0.40–1.20)
GFR: 103.64 mL/min (ref >60.00)
Glucose, Bld: 140 mg/dL — ABNORMAL HIGH (ref 70–99)
Potassium: 3.8 mEq/L (ref 3.5–5.1)
Sodium: 138 mEq/L (ref 135–145)
Total Bilirubin: 0.8 mg/dL (ref 0.2–1.2)
Total Protein: 7.1 g/dL (ref 6.0–8.3)

## 2021-04-09 LAB — CBC WITH DIFFERENTIAL/PLATELET
Basophils Absolute: 0 10*3/uL (ref 0.0–0.1)
Basophils Relative: 0.5 % (ref 0.0–3.0)
Eosinophils Absolute: 0.1 10*3/uL (ref 0.0–0.7)
Eosinophils Relative: 3.1 % (ref 0.0–5.0)
HCT: 34.5 % — ABNORMAL LOW (ref 36.0–46.0)
Hemoglobin: 11.6 g/dL — ABNORMAL LOW (ref 12.0–15.0)
Lymphocytes Relative: 43.2 % (ref 12.0–46.0)
Lymphs Abs: 1.5 10*3/uL (ref 0.7–4.0)
MCHC: 33.6 g/dL (ref 30.0–36.0)
MCV: 76.6 fl — ABNORMAL LOW (ref 78.0–100.0)
Monocytes Absolute: 0.3 10*3/uL (ref 0.1–1.0)
Monocytes Relative: 8 % (ref 3.0–12.0)
Neutro Abs: 1.5 10*3/uL (ref 1.4–7.7)
Neutrophils Relative %: 45.2 % (ref 43.0–77.0)
Platelets: 205 10*3/uL (ref 150.0–400.0)
RBC: 4.5 Mil/uL (ref 3.87–5.11)
RDW: 14.1 % (ref 11.5–15.5)
WBC: 3.4 10*3/uL — ABNORMAL LOW (ref 4.0–10.5)

## 2021-04-09 LAB — LIPID PANEL
Cholesterol: 130 mg/dL (ref 0–200)
HDL: 66 mg/dL (ref >39.00)
LDL Cholesterol: 57 mg/dL (ref 0–99)
NonHDL: 63.97
Total CHOL/HDL Ratio: 2
Triglycerides: 35 mg/dL (ref 0.0–149.0)
VLDL: 7 mg/dL (ref 0.0–40.0)

## 2021-04-09 LAB — MICROALBUMIN / CREATININE URINE RATIO
Creatinine,U: 239.3 mg/dL
Microalb Creat Ratio: 0.4 mg/g (ref 0.0–30.0)
Microalb, Ur: 0.9 mg/dL (ref 0.0–1.9)

## 2021-04-10 LAB — HEPATITIS C ANTIBODY
Hepatitis C Ab: NONREACTIVE
SIGNAL TO CUT-OFF: 0 (ref <1.00)

## 2021-05-30 ENCOUNTER — Encounter: Payer: Self-pay | Admitting: Family Medicine

## 2021-07-21 ENCOUNTER — Ambulatory Visit
Admission: RE | Admit: 2021-07-21 | Discharge: 2021-07-21 | Disposition: A | Discharge status: Home / Self Care | Payer: BC Managed Care – PPO | Source: Ambulatory Visit | Attending: Family Medicine | Admitting: Family Medicine

## 2021-07-21 ENCOUNTER — Other Ambulatory Visit: Payer: Self-pay

## 2021-07-21 DIAGNOSIS — Z0000 Encounter for general adult medical examination without abnormal findings: Secondary | ICD-10-CM

## 2021-07-21 DIAGNOSIS — Z1231 Encounter for screening mammogram for malignant neoplasm of breast: Secondary | ICD-10-CM

## 2021-08-29 IMAGING — MG DIGITAL SCREENING BILAT W/ CAD
4 series · 4 of 4 positions shown · non-contrast
Comparison: Previous exam(s).

CLINICAL DATA: Screening.

EXAM:
DIGITAL SCREENING BILATERAL MAMMOGRAM WITH CAD
TECHNIQUE: Bilateral screening digital craniocaudal and mediolateral oblique
mammograms were obtained. The images were evaluated with
computer-aided detection.

[R CC]
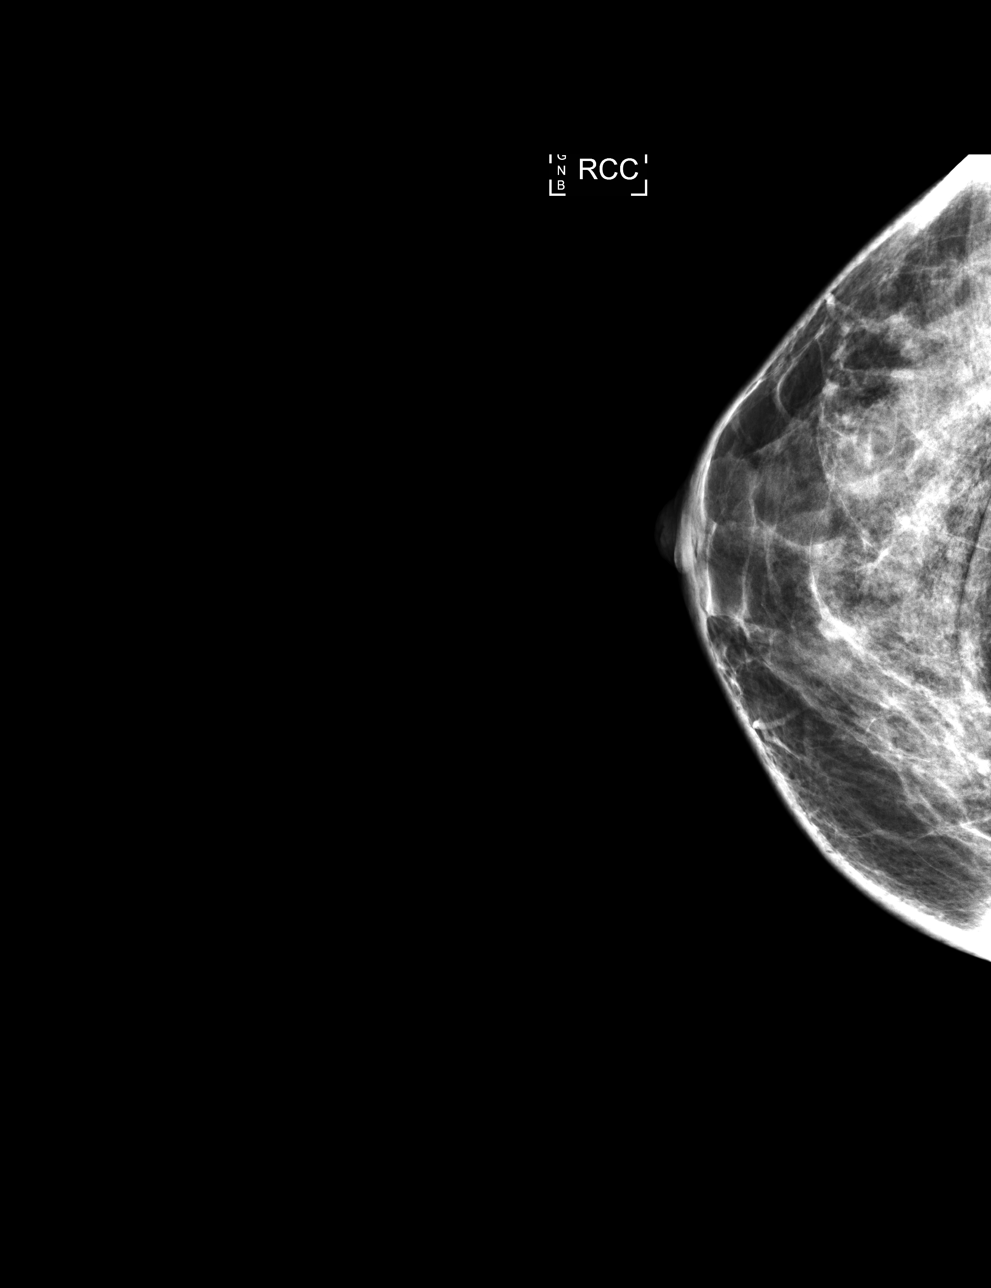

[L MLO]
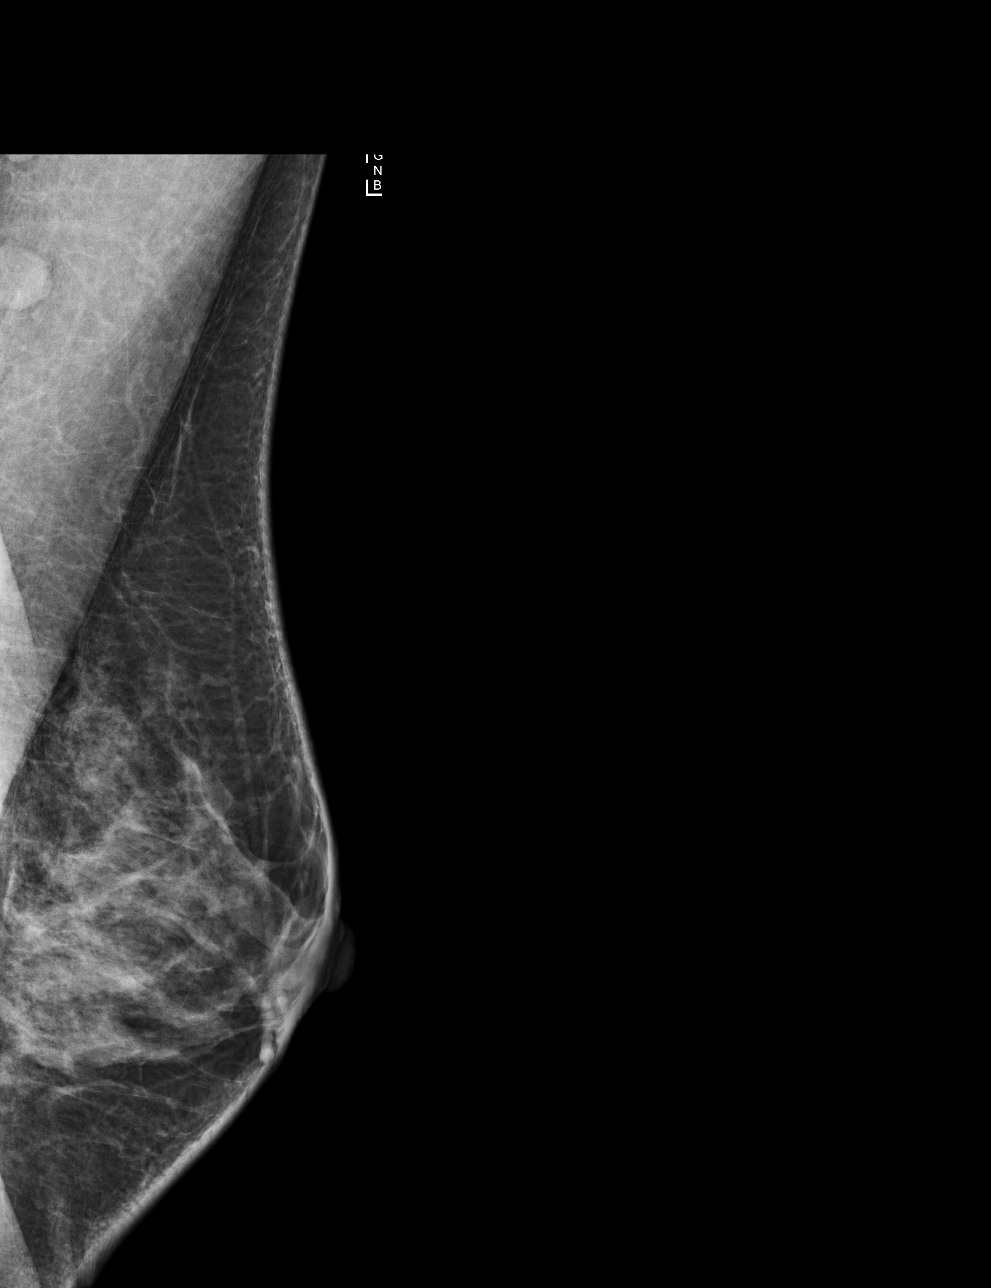

[L CC]
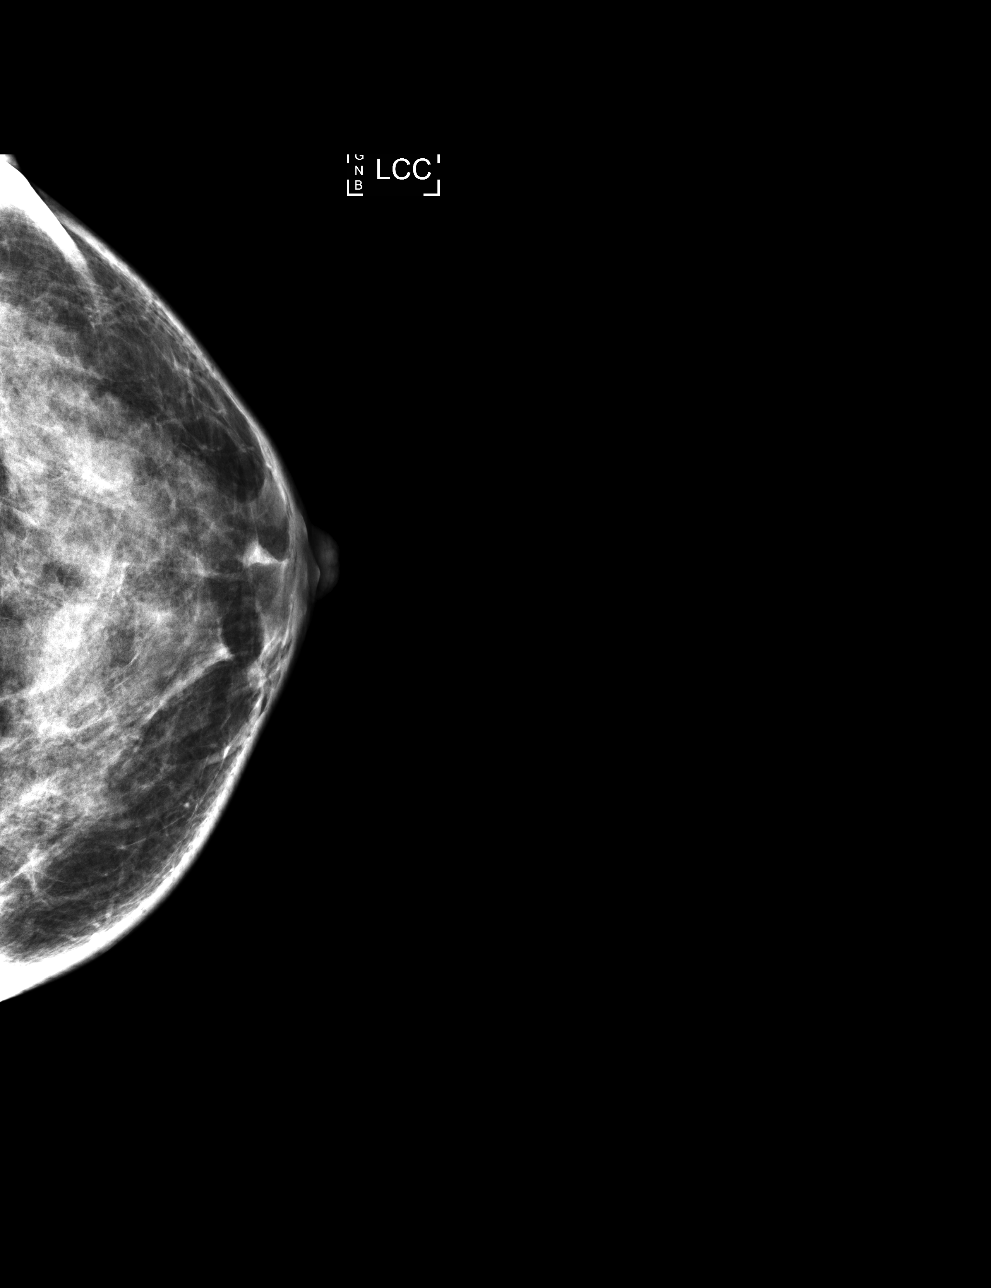

[R MLO]
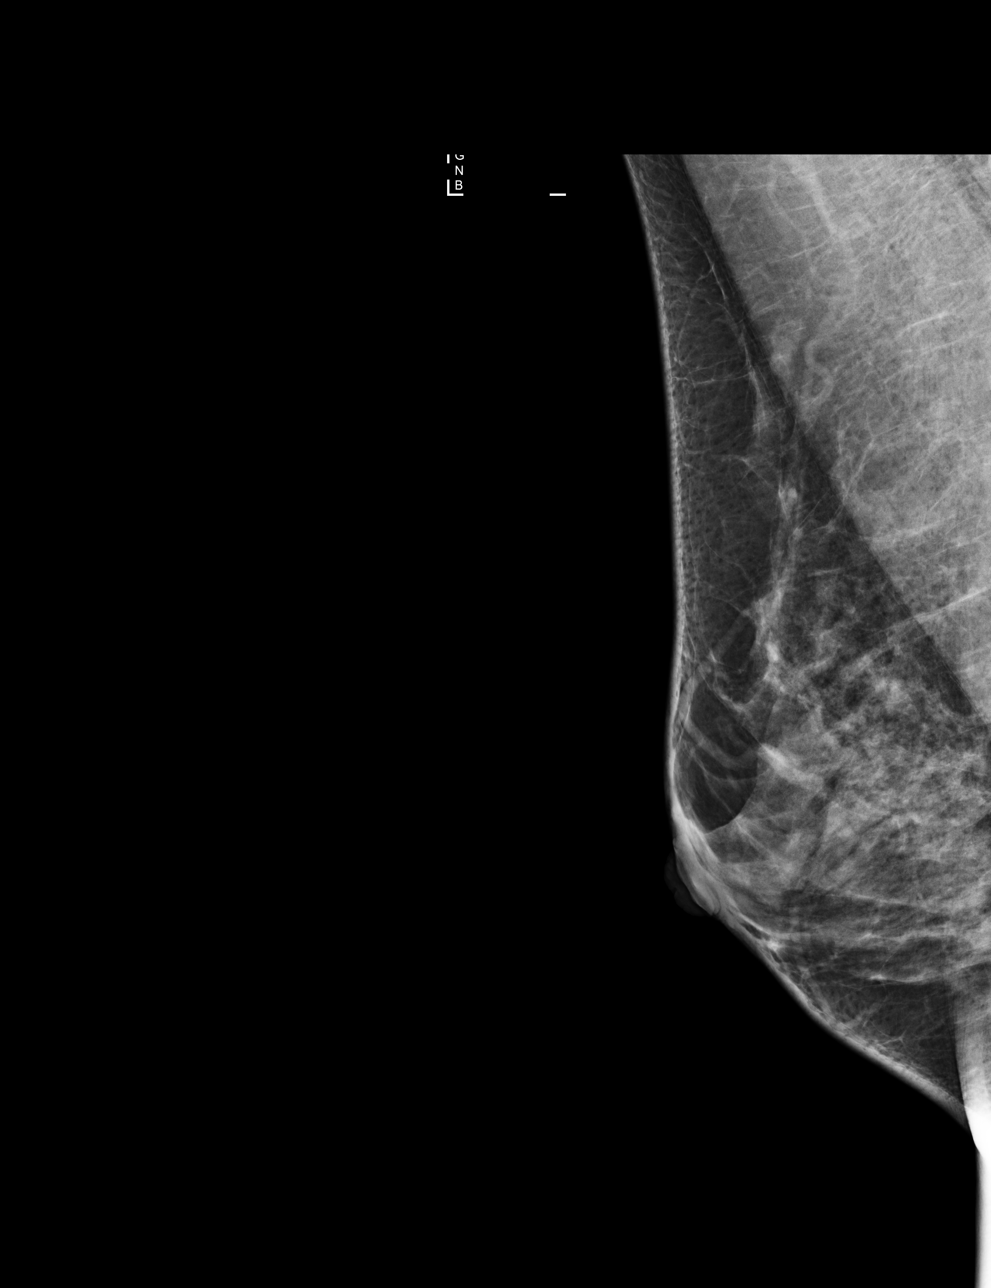

[4 of 4 positions shown; findings below may reference images not displayed]

ACR Breast Density Category c: The breast tissue is heterogeneously
dense, which may obscure small masses.
FINDINGS: There are no findings suspicious for malignancy.
IMPRESSION: No mammographic evidence of malignancy. A result letter of this
screening mammogram will be mailed directly to the patient.

RECOMMENDATION:
Screening mammogram in one year. (Code:TJ-B-ZPA)

BI-RADS CATEGORY  1: Negative.

## 2021-11-28 DIAGNOSIS — Z794 Long term (current) use of insulin: Secondary | ICD-10-CM | POA: Diagnosis not present

## 2021-11-28 DIAGNOSIS — E1065 Type 1 diabetes mellitus with hyperglycemia: Secondary | ICD-10-CM | POA: Diagnosis not present

## 2021-11-28 DIAGNOSIS — E0500 Thyrotoxicosis with diffuse goiter without thyrotoxic crisis or storm: Secondary | ICD-10-CM | POA: Diagnosis not present

## 2021-11-28 DIAGNOSIS — E103213 Type 1 diabetes mellitus with mild nonproliferative diabetic retinopathy with macular edema, bilateral: Secondary | ICD-10-CM | POA: Diagnosis not present

## 2021-11-28 DIAGNOSIS — E0590 Thyrotoxicosis, unspecified without thyrotoxic crisis or storm: Secondary | ICD-10-CM | POA: Diagnosis not present

## 2021-12-28 ENCOUNTER — Telehealth: Payer: BC Managed Care – PPO | Admitting: Physician Assistant

## 2021-12-28 DIAGNOSIS — U071 COVID-19: Secondary | ICD-10-CM

## 2021-12-28 MED ORDER — BENZONATATE 100 MG PO CAPS: 100.0000 mg | ORAL_CAPSULE | Freq: Three times a day (TID) | ORAL | 0 refills | Status: DC | PRN

## 2021-12-28 MED ORDER — IPRATROPIUM BROMIDE 0.03 % NA SOLN: 2.0000 | Freq: Two times a day (BID) | NASAL | 0 refills | Status: DC

## 2021-12-28 NOTE — Progress Notes (Signed)
E-Visit  for Positive Covid Test Result  We are sorry you are not feeling well. We are here to help!  You have tested positive for COVID-19, meaning that you were infected with the novel coronavirus and could give the virus to others.  It is vitally important that you stay home so you do not spread it to others.      Please continue isolation at home, for at least 10 days since the start of your symptoms and until you have had 24 hours with no fever (without taking a fever reducer) and with improving of symptoms.  If you have no symptoms but tested positive (or all symptoms resolve after 5 days and you have no fever) you can leave your house but continue to wear a mask around others for an additional 5 days. If you have a fever,continue to stay home until you have had 24 hours of no fever. Most cases improve 5-10 days from onset but we have seen a small number of patients who have gotten worse after the 10 days.  Please be sure to watch for worsening symptoms and remain taking the proper precautions.   Go to the nearest hospital ED for assessment if fever/cough/breathlessness are severe or illness seems like a threat to life.    The following symptoms may appear 2-14 days after exposure: Fever Cough Shortness of breath or difficulty breathing Chills Repeated shaking with chills Muscle pain Headache Sore throat New loss of taste or smell Fatigue Congestion or runny nose Nausea or vomiting Diarrhea  You have been enrolled in [facility] for COVID-19. Daily you will receive a questionnaire within the [facility] website. Our COVID-19 response team will be monitoring your responses daily.  You can use medication such as prescription cough medication called Tessalon Perles 100 mg. You may take 1-2 capsules every 8 hours as needed for cough and Ipratropium Bromide nasal spray 2 sprays each nostril twice daily as needed for congestion and drainage  You may also take acetaminophen  (Tylenol) as needed for fever.  HOME CARE: Only take medications as instructed by your medical team. Drink plenty of fluids and get plenty of rest. A steam or ultrasonic humidifier can help if you have congestion.   GET HELP RIGHT AWAY IF YOU HAVE EMERGENCY WARNING SIGNS.  Call 911 or proceed to your closest emergency facility if: You develop worsening high fever. Trouble breathing Bluish lips or face Persistent pain or pressure in the chest New confusion Inability to wake or stay awake You cough up blood. Your symptoms become more severe Inability to hold down food or fluids  This list is not all possible symptoms. Contact your medical provider for any symptoms that are severe or concerning to you.    Your e-visit answers were reviewed by a board certified advanced clinical practitioner to complete your personal care plan.  Depending on the condition, your plan could have included both over the counter or prescription medications.  If there is a problem please reply once you have received a response from your provider.  Your safety is important to [place].  If you have drug allergies check your prescription carefully.    You can use MyChart to ask questions about today's visit, request a non-urgent call back, or ask for a work or school excuse for 24 hours related to this e-Visit. If it has been greater than 24 hours you will need to follow up with your provider, or enter a new e-Visit to  address those concerns. You will get an e-mail in the next two days asking about your experience.  I hope that your e-visit has been valuable and will speed your recovery. Thank you for using e-visits.   I provided 5 minutes of non face-to-face time during this encounter for chart review and documentation.

## 2021-12-28 NOTE — Addendum Note (Signed)
Addended by: Mar Daring on: 12/28/2021 02:21 PM   Modules accepted: Orders

## 2021-12-28 NOTE — Progress Notes (Signed)
Based on the information that you have shared in the e-Visit Questionnaire, we recommend that you convert this visit to a video visit in order for the provider to better assess what is going on.  The provider will be able to give you a more accurate diagnosis and treatment plan if we can more freely discuss your symptoms and with the addition of a virtual examination.   You are a candidate for anti-viral treatment being that you are a T1DM. If you desire the FDA-emergency use antiviral medication options, we do require a video visit for that. If you do not, please respond here and we can add symptomatic management.  If you convert to a video visit, we will bill your insurance (similar to an office visit) and you will not be charged for this e-Visit. You will be able to stay at home and speak with the first available [facility] advanced practice provider. The link to do a video visit is in the drop down Menu tab of your Welcome screen in [place].  I provided 5 minutes of non face-to-face time during this encounter for chart review and documentation.

## 2022-01-15 DIAGNOSIS — E1065 Type 1 diabetes mellitus with hyperglycemia: Secondary | ICD-10-CM | POA: Diagnosis not present

## 2022-01-15 DIAGNOSIS — N920 Excessive and frequent menstruation with regular cycle: Secondary | ICD-10-CM | POA: Diagnosis not present

## 2022-01-15 DIAGNOSIS — N915 Oligomenorrhea, unspecified: Secondary | ICD-10-CM | POA: Diagnosis not present

## 2022-01-15 LAB — HM DIABETES EYE EXAM

## 2022-01-18 ENCOUNTER — Other Ambulatory Visit: Payer: Self-pay | Admitting: Family Medicine

## 2022-01-19 DIAGNOSIS — E103213 Type 1 diabetes mellitus with mild nonproliferative diabetic retinopathy with macular edema, bilateral: Secondary | ICD-10-CM | POA: Diagnosis not present

## 2022-01-19 DIAGNOSIS — E0500 Thyrotoxicosis with diffuse goiter without thyrotoxic crisis or storm: Secondary | ICD-10-CM | POA: Diagnosis not present

## 2022-01-19 DIAGNOSIS — E0590 Thyrotoxicosis, unspecified without thyrotoxic crisis or storm: Secondary | ICD-10-CM | POA: Diagnosis not present

## 2022-01-19 DIAGNOSIS — Z794 Long term (current) use of insulin: Secondary | ICD-10-CM | POA: Diagnosis not present

## 2022-01-19 DIAGNOSIS — E1065 Type 1 diabetes mellitus with hyperglycemia: Secondary | ICD-10-CM | POA: Diagnosis not present

## 2022-02-02 DIAGNOSIS — N924 Excessive bleeding in the premenopausal period: Secondary | ICD-10-CM | POA: Diagnosis not present

## 2022-02-02 DIAGNOSIS — N946 Dysmenorrhea, unspecified: Secondary | ICD-10-CM | POA: Diagnosis not present

## 2022-02-16 DIAGNOSIS — E0500 Thyrotoxicosis with diffuse goiter without thyrotoxic crisis or storm: Secondary | ICD-10-CM | POA: Diagnosis not present

## 2022-02-16 DIAGNOSIS — E1065 Type 1 diabetes mellitus with hyperglycemia: Secondary | ICD-10-CM | POA: Diagnosis not present

## 2022-02-16 DIAGNOSIS — E0590 Thyrotoxicosis, unspecified without thyrotoxic crisis or storm: Secondary | ICD-10-CM | POA: Diagnosis not present

## 2022-02-16 DIAGNOSIS — Z794 Long term (current) use of insulin: Secondary | ICD-10-CM | POA: Diagnosis not present

## 2022-02-16 DIAGNOSIS — E103213 Type 1 diabetes mellitus with mild nonproliferative diabetic retinopathy with macular edema, bilateral: Secondary | ICD-10-CM | POA: Diagnosis not present

## 2022-02-17 LAB — HEMOGLOBIN A1C: Hemoglobin A1C: 8.1

## 2022-02-25 ENCOUNTER — Telehealth: Payer: Self-pay

## 2022-02-25 NOTE — Telephone Encounter (Signed)
Pt would like to know if 05/02 would be possible? ?

## 2022-02-25 NOTE — Telephone Encounter (Signed)
Please schedule pt at 9:40 am on 04/28/22 for CPE. ?

## 2022-02-25 NOTE — Telephone Encounter (Signed)
Patient has been scheduled

## 2022-02-25 NOTE — Telephone Encounter (Signed)
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 ?

## 2022-02-25 NOTE — Telephone Encounter (Signed)
9:40 AM on 2 May will work ?

## 2022-02-25 NOTE — Telephone Encounter (Signed)
Looks like we already have worked another patient in that morning-would be potentially challenging-does she have any other dates? ? ?If not we can still try to work it out at 940 that morning as a work in ?

## 2022-03-02 ENCOUNTER — Other Ambulatory Visit: Payer: Self-pay

## 2022-03-02 MED ORDER — DOXYCYCLINE HYCLATE 100 MG PO TABS: 100.0000 mg | ORAL_TABLET | Freq: Two times a day (BID) | ORAL | 3 refills | Status: DC

## 2022-03-25 NOTE — Progress Notes (Addendum)
? ?Phone [phone] ?  ?Subjective:  ?Patient presents today for their annual physical. Chief complaint-noted.  ? ?See problem oriented charting- ?ROS- full  review of systems was completed and negative ?except for: fatigue, seasonal allergies, down mood ? ?The following were reviewed and entered/updated in epic: ?Past Medical History:  ?Diagnosis Date  ? Alopecia   ? CCCA  ? Diabetes mellitus without complication ([place])   ? diagnosed at 46 years old 61. Dr. Delrae Rend  ? UTI (urinary tract infection)   ? ?Patient Active Problem List  ? Diagnosis Date Noted  ? 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  Priority: High  ? Acne 03/08/2020  ?  Priority: Medium   ? Graves disease 02/07/2019  ?  Priority: Medium   ? Adhesive capsulitis 12/03/2017  ?  Priority: Low  ? GERD (gastroesophageal reflux disease) 10/29/2020  ? ?Past Surgical History:  ?Procedure Laterality Date  ? none    ? ? ?Family History  ?Problem Relation Age of Onset  ? Hypertension Mother   ? Arthritis Mother   ? Hyperlipidemia Father   ? Hypertension Father   ? Atrial fibrillation Father   ?     cardioversion and ablation.   ? Multiple myeloma Brother   ?     passed at 26 from this  ? Breast cancer Paternal Grandmother   ? Pancreatic cancer Paternal Grandfather   ? ? ?Medications- reviewed and updated ?Current Outpatient Medications  ?Medication Sig Dispense Refill  ? Adapalene-Benzoyl Peroxide 0.1-2.5 % gel APPLY EXTERNALLY TO THE AFFECTED AREA AT BEDTIME 45 g 5  ? atenolol (TENORMIN) 25 MG tablet Take 25 mg by mouth daily.     ? atorvastatin (LIPITOR) 10 MG tablet Take 10 mg by mouth daily.    ? doxycycline (VIBRA-TABS) 100 MG tablet Take 1 tablet (100 mg total) by mouth 2 (two) times daily. 180 tablet 3  ? Insulin Lispro-aabc (LYUMJEV IJ) Inject as directed.    ? ipratropium (ATROVENT) 0.03 % nasal spray Place 2 sprays into both nostrils every 12 (twelve) hours. 30 mL 0  ? methimazole (TAPAZOLE) 10 MG tablet Take 5  mg by mouth daily.     ? MINOXIDIL PO Take 2.5 mg by mouth. 1/2 tablet daily    ? senna-docusate (SENOKOT-S) 8.6-50 MG tablet Take 1 tablet by mouth at bedtime as needed for mild constipation or moderate constipation. 30 tablet 0  ? pantoprazole (PROTONIX) 40 MG tablet Take 1 tablet (40 mg total) by mouth daily. 30 tablet 3  ? ?No current facility-administered medications for this visit.  ? ? ?Allergies-reviewed and updated ?Allergies  ?Allergen Reactions  ? Meloxicam Other (See Comments)  ?  Other Reaction: itchy  ? ? ?Social History  ? ?Social History Narrative  ? Married Sept 2018. Lives with husband. Mom keeps her yorkiepoo. Son is 35 years old in 2018. Patient's husband has disabled son born 19 who is nonverbal from seizure activity- requires full time care.   ?   ? At [employer] student health started September 2021  ? Prior Bariatric Care with [facility] right now.   ? [employer] in [place]  ? [employer]  ? 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 MSN in nursing  ?   ? 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   ? ?Objective  ?Objective:  ?BP 132/76  Pulse 87   Temp 98.2 ?F (36.8 ?C)   Ht _0  (1.778 m)   Wt 195 lb (88.5 kg)   SpO2 98%   BMI 27.98 kg/m?  ?Gen: NAD, resting comfortably ?HEENT: Mucous membranes are moist. Oropharynx normal ?Neck: no thyromegaly ?CV: RRR no murmurs rubs or gallops ?Lungs: CTAB no crackles, wheeze, rhonchi ?Abdomen: soft/nontender/nondistended/normal bowel sounds. No rebound or guarding.  ?Ext: no edema ?Skin: warm, dry ?Neuro: grossly normal, moves all extremities, PERRLA ? ?Diabetic Foot Exam - Simple   ?Simple Foot Form ?Diabetic Foot exam was performed with the following findings: Yes 04/28/2022 10:43 AM  ?Visual Inspection ?No deformities, no ulcerations, no other skin breakdown bilaterally: Yes ?Sensation Testing ?Intact to touch and monofilament  testing bilaterally: Yes ?Pulse Check ?Posterior Tibialis and Dorsalis pulse intact bilaterally: Yes ?Comments ?  ?   ?  ?Assessment and Plan  ? ?46 y.o. female presenting for annual physical.  ?Health Maintenance counseling: ?1. Anticipatory guidance: Patient counseled regarding regular dental exams -q6 months, eye exams-yearly on file- [facility],  avoiding smoking and second hand smoke , limiting alcohol to 1 beverage per day- 1 per month.  No illicit drugs ?2. Risk factor reduction:  Advised patient of need for regular exercise and diet rich and fruits and vegetables to reduce risk of heart attack and stroke.  ?Exercise-exercise was challenging at last visit due to work schedule-today reports still a barrier- considering cutting back on extra shifts.  ?Diet--weight stable over the course of last year- down 2 lbs from last year- has cut down on red meat and doing more veggies- some back and forth with this.  ?Wt Readings from Last 3 Encounters:  ?04/28/22 195 lb (88.5 kg)  ?04/09/21 197 lb 3.2 oz (89.4 kg)  ?06/01/20 195 lb (88.5 kg)  ?3. Immunizations/screenings/ancillary studies ?DISCUSSED:  ?-COVID booster vaccination - still considering bivalent booster ?-Vision Exam - had done in [place] release ?Immunization History  ?Administered Date(s) Administered  ? Influenza Split 11/06/2016, 09/27/2018  ? Influenza,inj,Quad PF,6+ Mos 10/13/2019, 10/04/2020  ? Influenza-Unspecified 09/27/2017, 10/07/2018  ? Moderna Sars-Covid-2 Vaccination 11/01/2020  ? PFIZER(Purple Top)SARS-COV-2 Vaccination 12/30/2019, 01/20/2020  ? PPD Test 01/05/2018, 04/12/2019, 04/29/2019, 05/22/2020  ? Pneumococcal Polysaccharide-23 09/09/2016  ? Tdap 12/09/2010, 02/07/2019  ? 4. Cervical cancer screening- pap smear 02/07/19 with 5 year repeat planned with our office- may have next with [facility] ?- did have some heavy cramping/bleeding and saw Dr. Lynnette Caffey with [facility]- polypoid cyst removed recently and  follow up everything was stable ?5. Breast cancer screening-  breast exam declined-prefers self exams- and mammogram 07/21/21 with 1 year repeat planned ?6. Colon cancer screening -  discussed newer guidelines and wants cologuard instead. No family history polyps or colon cancer ?7. Skin cancer screening--sees dermatology for alopecia. Lower risk for skin ancer. advised regular sunscreen use. Denies worrisome, changing, or new skin lesions.  ?8. Birth control/STD check- no birth control.  Has had fertility issues. No concern for STDs. Thinks perimenopausal  still- some spacing of periods- and see workup above ?9. Osteoporosis screening at 25- consider post menopause- at [place] at 38 ?-never smoker ? ?Status of chronic or acute concerns  ? 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 Parents still really struggling ?- back at urgent care in [place]- only provider in [place]. Getting 2nd provider later this year  thankfully.  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 ?#Fatigue that comes and goes- has to get to bed by 10 or 11 and just feels run down. Thinks some of it from being out of shape. Basically goes to work and comes home. Some of this could be grieving- improving overall.  ?- considering seeing a therapist ?-gave a few options  ?  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  ?# Diabetes Type I- follows with Dr. Buddy Duty ?S: Medication:Lyumjev/insulin pump- on omnipod 5 and decxom 6 most recently ?CBGs- rare lows but usually depends on food intake- appetite often low early in the day ?Lab Results  ?Component Value Date  ? HGBA1C 8.4 03/20/2021  ? HGBA1C 7.8 12/01/2019  ? HGBA1C 8.6 (A) 02/07/2019  ?A/P: mild poor control-  continue to work with Dr. Buddy Duty.  ?  ?#Graves' disease ?S: Medication-controlled on methimazol 5 mg and atenolol  25 mg ?A/P: stable on recent check in march per her report-Continue current meds for now  and follow

## 2022-04-13 ENCOUNTER — Encounter: Payer: BC Managed Care – PPO | Admitting: Family Medicine

## 2022-04-13 DIAGNOSIS — N946 Dysmenorrhea, unspecified: Secondary | ICD-10-CM | POA: Diagnosis not present

## 2022-04-13 DIAGNOSIS — N920 Excessive and frequent menstruation with regular cycle: Secondary | ICD-10-CM | POA: Diagnosis not present

## 2022-04-13 DIAGNOSIS — N840 Polyp of corpus uteri: Secondary | ICD-10-CM | POA: Diagnosis not present

## 2022-04-13 DIAGNOSIS — Z3202 Encounter for pregnancy test, result negative: Secondary | ICD-10-CM | POA: Diagnosis not present

## 2022-04-13 DIAGNOSIS — N924 Excessive bleeding in the premenopausal period: Secondary | ICD-10-CM | POA: Diagnosis not present

## 2022-04-28 ENCOUNTER — Ambulatory Visit (INDEPENDENT_AMBULATORY_CARE_PROVIDER_SITE_OTHER): Payer: 59 | Admitting: Family Medicine

## 2022-04-28 ENCOUNTER — Telehealth: Payer: Self-pay | Admitting: Family Medicine

## 2022-04-28 ENCOUNTER — Encounter: Payer: Self-pay | Admitting: Family Medicine

## 2022-04-28 VITALS — BP 132/76 | HR 87 | Temp 98.2°F | Ht 70.0 in | Wt 195.0 lb

## 2022-04-28 DIAGNOSIS — E782 Mixed hyperlipidemia: Secondary | ICD-10-CM

## 2022-04-28 DIAGNOSIS — D500 Iron deficiency anemia secondary to blood loss (chronic): Secondary | ICD-10-CM

## 2022-04-28 DIAGNOSIS — E10311 Type 1 diabetes mellitus with unspecified diabetic retinopathy with macular edema: Secondary | ICD-10-CM

## 2022-04-28 DIAGNOSIS — K219 Gastro-esophageal reflux disease without esophagitis: Secondary | ICD-10-CM | POA: Diagnosis not present

## 2022-04-28 DIAGNOSIS — Z0000 Encounter for general adult medical examination without abnormal findings: Secondary | ICD-10-CM

## 2022-04-28 DIAGNOSIS — Z1211 Encounter for screening for malignant neoplasm of colon: Secondary | ICD-10-CM

## 2022-04-28 LAB — LIPID PANEL
Cholesterol: 183 mg/dL (ref 0–200)
HDL: 83.6 mg/dL (ref >39.00)
LDL Cholesterol: 92 mg/dL (ref 0–99)
NonHDL: 99.85
Total CHOL/HDL Ratio: 2
Triglycerides: 41 mg/dL (ref 0.0–149.0)
VLDL: 8.2 mg/dL (ref 0.0–40.0)

## 2022-04-28 LAB — CBC WITH DIFFERENTIAL/PLATELET
Basophils Absolute: 0 10*3/uL (ref 0.0–0.1)
Basophils Relative: 0.5 % (ref 0.0–3.0)
Eosinophils Absolute: 0.1 10*3/uL (ref 0.0–0.7)
Eosinophils Relative: 2.7 % (ref 0.0–5.0)
HCT: 37.9 % (ref 36.0–46.0)
Hemoglobin: 12 g/dL (ref 12.0–15.0)
Lymphocytes Relative: 48.8 % — ABNORMAL HIGH (ref 12.0–46.0)
Lymphs Abs: 1.6 10*3/uL (ref 0.7–4.0)
MCHC: 31.7 g/dL (ref 30.0–36.0)
MCV: 74.6 fl — ABNORMAL LOW (ref 78.0–100.0)
Monocytes Absolute: 0.4 10*3/uL (ref 0.1–1.0)
Monocytes Relative: 11.8 % (ref 3.0–12.0)
Neutro Abs: 1.2 10*3/uL — ABNORMAL LOW (ref 1.4–7.7)
Neutrophils Relative %: 36.2 % — ABNORMAL LOW (ref 43.0–77.0)
Platelets: 232 10*3/uL (ref 150.0–400.0)
RBC: 5.08 Mil/uL (ref 3.87–5.11)
RDW: 14.6 % (ref 11.5–15.5)
WBC: 3.4 10*3/uL — ABNORMAL LOW (ref 4.0–10.5)

## 2022-04-28 LAB — COMPREHENSIVE METABOLIC PANEL
ALT: 10 U/L (ref 0–35)
AST: 16 U/L (ref 0–37)
Albumin: 4.2 g/dL (ref 3.5–5.2)
Alkaline Phosphatase: 55 U/L (ref 39–117)
BUN: 7 mg/dL (ref 6–23)
CO2: 31 mEq/L (ref 19–32)
Calcium: 9.3 mg/dL (ref 8.4–10.5)
Chloride: 106 mEq/L (ref 96–112)
Creatinine, Ser: 0.93 mg/dL (ref 0.40–1.20)
GFR: 72.91 mL/min (ref >60.00)
Glucose, Bld: 45 mg/dL — CL (ref 70–99)
Potassium: 4.3 mEq/L (ref 3.5–5.1)
Sodium: 143 mEq/L (ref 135–145)
Total Bilirubin: 0.7 mg/dL (ref 0.2–1.2)
Total Protein: 7.4 g/dL (ref 6.0–8.3)

## 2022-04-28 NOTE — Telephone Encounter (Signed)
Patient had an alert on her phone that sugar was dropping while we were together- she wanted to draw labs and planned to immediately eat something- can you check on her team and make sure sugars are doing better? ?

## 2022-04-28 NOTE — Telephone Encounter (Signed)
Critical lab: ?Time: 3:35 ?Low Glucose 45 ?

## 2022-04-28 NOTE — Patient Instructions (Addendum)
Sign release of information at the check out desk for Diabetic Eye Exam. ? ?Please stop by lab before you go ?If you have mychart- we will send your results within 3 business days of [place] receiving them.  ?If you do not have mychart- we will call you about results within 5 business days of [place] receiving them.  ?*please also note that you will see labs on mychart as soon as they post. I will later go in and write notes on them- will say "notes from Dr. Yong Channel"  ? ?Please call [phone] to schedule a visit with [facility] ?- please tell the office you were directly referred by Dr. Yong Channel ? ?[facility] sometimes can be backed up for up to 2 months so here are some other options to check in on: ?- [email or web address]  at  [phone] ?-[facility] [email or web address]/ at [phone]- [phone]- [phone] ? ? ?Recommended follow up: Return in about 1 year (around 04/29/2023) for physical or sooner if needed.Schedule b4 you leave.  ? ?

## 2022-04-29 NOTE — Telephone Encounter (Signed)
Spoke with pt yesterday evening and she was feeling much better.  ?

## 2022-05-06 ENCOUNTER — Other Ambulatory Visit (HOSPITAL_COMMUNITY): Payer: Self-pay

## 2022-05-06 MED ORDER — LYUMJEV 100 UNIT/ML IJ SOLN
70.0000 [IU] | Freq: Every day | INTRAMUSCULAR | 12 refills | Status: DC
Start: 2022-05-06 — End: 2023-05-11
  Filled 2022-05-06 – 2022-05-19 (×2): qty 20, 28d supply, fill #0
  Filled 2022-06-22: qty 20, 28d supply, fill #1
  Filled 2022-07-19: qty 20, 28d supply, fill #2
  Filled 2022-09-07: qty 20, 28d supply, fill #3
  Filled 2022-09-30: qty 20, 28d supply, fill #4
  Filled 2022-11-27: qty 20, 28d supply, fill #5
  Filled 2022-12-20: qty 20, 28d supply, fill #6
  Filled 2023-01-29: qty 20, 28d supply, fill #7
  Filled 2023-04-03: qty 20, 28d supply, fill #8

## 2022-05-06 MED ORDER — OMNIPOD 5 DEXG7G6 PODS GEN 5 MISC
4 refills | Status: DC
Start: 2022-05-06 — End: 2023-06-21
  Filled 2022-05-06 – 2022-05-07 (×3): qty 30, 90d supply, fill #0
  Filled 2022-07-23: qty 30, 90d supply, fill #1
  Filled 2022-09-30 – 2023-01-18 (×3): qty 30, 90d supply, fill #2
  Filled 2023-04-19: qty 30, 90d supply, fill #3

## 2022-05-06 MED ORDER — DEXCOM G6 TRANSMITTER MISC
1.0000 | 4 refills | Status: DC
  Filled 2022-05-06 – 2022-08-05 (×4): qty 1, 90d supply, fill #0

## 2022-05-06 MED ORDER — DEXCOM G6 SENSOR MISC
4 refills | Status: DC
  Filled 2022-05-06: qty 9, 90d supply, fill #0
  Filled 2022-05-07 – 2022-06-12 (×2): qty 3, 30d supply, fill #0
  Filled 2022-07-19: qty 9, 90d supply, fill #0
  Filled 2022-09-30: qty 9, 90d supply, fill #1

## 2022-05-06 MED ORDER — OMNIPOD 5 DEXG7G6 INTRO GEN 5 KIT
PACK | 1 refills | Status: DC
  Filled 2022-05-06: qty 1, 90d supply, fill #0
  Filled 2022-05-07 (×2): qty 1, 30d supply, fill #0

## 2022-05-07 ENCOUNTER — Other Ambulatory Visit (HOSPITAL_COMMUNITY): Payer: Self-pay

## 2022-05-08 ENCOUNTER — Other Ambulatory Visit (HOSPITAL_COMMUNITY): Payer: Self-pay

## 2022-05-19 ENCOUNTER — Other Ambulatory Visit (HOSPITAL_COMMUNITY): Payer: Self-pay

## 2022-05-20 ENCOUNTER — Other Ambulatory Visit (HOSPITAL_COMMUNITY): Payer: Self-pay

## 2022-06-01 DIAGNOSIS — Z1211 Encounter for screening for malignant neoplasm of colon: Secondary | ICD-10-CM | POA: Diagnosis not present

## 2022-06-04 ENCOUNTER — Telehealth: Payer: Self-pay | Admitting: Family Medicine

## 2022-06-04 DIAGNOSIS — E0500 Thyrotoxicosis with diffuse goiter without thyrotoxic crisis or storm: Secondary | ICD-10-CM | POA: Diagnosis not present

## 2022-06-04 DIAGNOSIS — E0590 Thyrotoxicosis, unspecified without thyrotoxic crisis or storm: Secondary | ICD-10-CM | POA: Diagnosis not present

## 2022-06-04 DIAGNOSIS — E1065 Type 1 diabetes mellitus with hyperglycemia: Secondary | ICD-10-CM | POA: Diagnosis not present

## 2022-06-04 DIAGNOSIS — D649 Anemia, unspecified: Secondary | ICD-10-CM | POA: Diagnosis not present

## 2022-06-04 DIAGNOSIS — Z794 Long term (current) use of insulin: Secondary | ICD-10-CM | POA: Diagnosis not present

## 2022-06-04 DIAGNOSIS — E103213 Type 1 diabetes mellitus with mild nonproliferative diabetic retinopathy with macular edema, bilateral: Secondary | ICD-10-CM | POA: Diagnosis not present

## 2022-06-04 NOTE — Telephone Encounter (Signed)
Opened chart to review last a1c and added from media tab from [facility]  Lab Results  Component Value Date   HGBA1C 8.1 02/17/2022

## 2022-06-05 DIAGNOSIS — E1065 Type 1 diabetes mellitus with hyperglycemia: Secondary | ICD-10-CM | POA: Diagnosis not present

## 2022-06-05 DIAGNOSIS — E0590 Thyrotoxicosis, unspecified without thyrotoxic crisis or storm: Secondary | ICD-10-CM | POA: Diagnosis not present

## 2022-06-05 DIAGNOSIS — E0500 Thyrotoxicosis with diffuse goiter without thyrotoxic crisis or storm: Secondary | ICD-10-CM | POA: Diagnosis not present

## 2022-06-05 DIAGNOSIS — D649 Anemia, unspecified: Secondary | ICD-10-CM | POA: Diagnosis not present

## 2022-06-11 LAB — COLOGUARD: COLOGUARD: NEGATIVE

## 2022-06-15 ENCOUNTER — Other Ambulatory Visit (HOSPITAL_COMMUNITY): Payer: Self-pay

## 2022-06-22 ENCOUNTER — Other Ambulatory Visit (HOSPITAL_COMMUNITY): Payer: Self-pay

## 2022-06-25 ENCOUNTER — Other Ambulatory Visit (HOSPITAL_COMMUNITY): Payer: Self-pay

## 2022-07-06 ENCOUNTER — Telehealth: Payer: 59 | Admitting: Physician Assistant

## 2022-07-06 DIAGNOSIS — H9201 Otalgia, right ear: Secondary | ICD-10-CM

## 2022-07-07 MED ORDER — CIPROFLOXACIN-HYDROCORTISONE 0.2-1 % OT SUSP: 3.0000 [drp] | Freq: Two times a day (BID) | OTIC | 0 refills | Status: DC

## 2022-07-07 MED ORDER — NEOMYCIN-POLYMYXIN-HC 3.5-10000-1 OT SOLN: 3.0000 [drp] | Freq: Four times a day (QID) | OTIC | 0 refills | Status: AC

## 2022-07-07 NOTE — Addendum Note (Signed)
Addended by: Waldon Merl on: 07/07/2022 11:41 AM   Modules accepted: Orders

## 2022-07-07 NOTE — Progress Notes (Signed)
I have spent 5 minutes in review of e-visit questionnaire, review and updating patient chart, medical decision making and response to patient.   Tab Rylee Cody Noris Kulinski, PA-C    

## 2022-07-07 NOTE — Progress Notes (Signed)
E Visit for Swimmer's Ear  We are sorry that you are not feeling well. Here is how we plan to help!  Based on what you have shared with me it looks like you have swimmers ear. Swimmer's ear is a redness or swelling, irritation, or infection of your outer ear canal.  These symptoms usually occur within a few days of swimming.  Your ear canal is a tube that goes from the opening of the ear to the eardrum.  When water stays in your ear canal, germs can grow.  This is a painful condition that often happens to children and swimmers of all ages.  It is not contagious and oral antibiotics are not required to treat uncomplicated swimmer's ear.  The usual symptoms include: Itching inside the ear, Redness or a sense of swelling in the ear, Pain when the ear is tugged on when pressure is placed on the ear, Pus draining from the infected ear. and I have prescribed: Ciprofloxin 0.2% and hydrocortisone 1% otic suspension 3 drops in affected ears twice daily for 7 days   In certain cases swimmer's ear may progress to a more serious bacterial infection of the middle or inner ear.  If you have a fever 102 and up and significantly worsening symptoms, this could indicate a more serious infection moving to the middle/inner and needs face to face evaluation in an office by a provider.  Your symptoms should improve over the next 3 days and should resolve in about 7 days.  HOME CARE:  Wash your hands frequently. Do not place the tip of the bottle on your ear or touch it with your fingers. You can take Acetominophen 650 mg every 4-6 hours as needed for pain.  If pain is severe or moderate, you can apply a heating pad (set on low) or hot water bottle (wrapped in a towel) to outer ear for 20 minutes.  This will also increase drainage. Avoid ear plugs Do not use Q-tips After showers, help the water run out by tilting your head to one side.  GET HELP RIGHT AWAY IF:  Fever is over 102.2 degrees. You develop progressive  ear pain or hearing loss. Ear symptoms persist longer than 3 days after treatment.  MAKE SURE YOU:  Understand these instructions. Will watch your condition. Will get help right away if you are not doing well or get worse.  TO PREVENT SWIMMER'S EAR: Use a bathing cap or custom fitted swim molds to keep your ears dry. Towel off after swimming to dry your ears. Tilt your head or pull your earlobes to allow the water to escape your ear canal. If there is still water in your ears, consider using a hairdryer on the lowest setting.   Thank you for choosing an e-visit.  Your e-visit answers were reviewed by a board certified advanced clinical practitioner to complete your personal care plan. Depending upon the condition, your plan could have included both over the counter or prescription medications.  Please review your pharmacy choice. Make sure the pharmacy is open so you can pick up prescription now. If there is a problem, you may contact your provider through MyChart messaging and have the prescription routed to another pharmacy.  Your safety is important to us. If you have drug allergies check your prescription carefully.   For the next 24 hours you can use MyChart to ask questions about today's visit, request a non-urgent call back, or ask for a work or school excuse. You will get   an email in the next two days asking about your experience. I hope that your e-visit has been valuable and will speed your recovery.    

## 2022-07-10 ENCOUNTER — Telehealth: Payer: 59 | Admitting: Physician Assistant

## 2022-07-10 DIAGNOSIS — J019 Acute sinusitis, unspecified: Secondary | ICD-10-CM | POA: Diagnosis not present

## 2022-07-10 DIAGNOSIS — B9689 Other specified bacterial agents as the cause of diseases classified elsewhere: Secondary | ICD-10-CM | POA: Diagnosis not present

## 2022-07-10 DIAGNOSIS — E0590 Thyrotoxicosis, unspecified without thyrotoxic crisis or storm: Secondary | ICD-10-CM | POA: Diagnosis not present

## 2022-07-10 MED ORDER — AMOXICILLIN-POT CLAVULANATE 875-125 MG PO TABS: 1.0000 | ORAL_TABLET | Freq: Two times a day (BID) | ORAL | 0 refills | Status: DC

## 2022-07-10 NOTE — Progress Notes (Signed)
Message sent to patient requesting further input regarding current symptoms. Awaiting patient response.  

## 2022-07-10 NOTE — Progress Notes (Signed)
Patient clarified she is not pregnant -- selected wrong response when submitting her initial questionnaire.   E-Visit for Sinus Problems  We are sorry that you are not feeling well.  Here is how we plan to help!  Based on what you have shared with me it looks like you have sinusitis.  Sinusitis is inflammation and infection in the sinus cavities of the head.  Based on your presentation I believe you most likely have Acute Bacterial Sinusitis.  This is an infection caused by bacteria and is treated with antibiotics. I have prescribed Augmentin 875mg /125mg  one tablet twice daily with food, for 7 days. You may use an oral decongestant such as Mucinex D or if you have glaucoma or high blood pressure use plain Mucinex. Saline nasal spray help and can safely be used as often as needed for congestion.  If you develop worsening sinus pain, fever or notice severe headache and vision changes, or if symptoms are not better after completion of antibiotic, please schedule an appointment with a health care provider.    Sinus infections are not as easily transmitted as other respiratory infection, however we still recommend that you avoid close contact with loved ones, especially the very young and elderly.  Remember to wash your hands thoroughly throughout the day as this is the number one way to prevent the spread of infection!  Home Care: Only take medications as instructed by your medical team. Complete the entire course of an antibiotic. Do not take these medications with alcohol. A steam or ultrasonic humidifier can help congestion.  You can place a towel over your head and breathe in the steam from hot water coming from a faucet. Avoid close contacts especially the very young and the elderly. Cover your mouth when you cough or sneeze. Always remember to wash your hands.  Get Help Right Away If: You develop worsening fever or sinus pain. You develop a severe head ache or visual changes. Your symptoms  persist after you have completed your treatment plan.  Make sure you Understand these instructions. Will watch your condition. Will get help right away if you are not doing well or get worse.  Thank you for choosing an e-visit.  Your e-visit answers were reviewed by a board certified advanced clinical practitioner to complete your personal care plan. Depending upon the condition, your plan could have included both over the counter or prescription medications.  Please review your pharmacy choice. Make sure the pharmacy is open so you can pick up prescription now. If there is a problem, you may contact your provider through and have the prescription routed to another pharmacy.  Your safety is important to [employer]. If you have drug allergies check your prescription carefully.   For the next 24 hours you can use MyChart to ask questions about today's visit, request a non-urgent call back, or ask for a work or school excuse. You will get an email in the next two days asking about your experience. I hope that your e-visit has been valuable and will speed your recovery.

## 2022-07-20 ENCOUNTER — Other Ambulatory Visit (HOSPITAL_COMMUNITY): Payer: Self-pay

## 2022-07-21 ENCOUNTER — Other Ambulatory Visit (HOSPITAL_COMMUNITY): Payer: Self-pay

## 2022-07-23 ENCOUNTER — Other Ambulatory Visit (HOSPITAL_COMMUNITY): Payer: Self-pay

## 2022-07-24 ENCOUNTER — Other Ambulatory Visit (HOSPITAL_COMMUNITY): Payer: Self-pay

## 2022-08-05 ENCOUNTER — Other Ambulatory Visit (HOSPITAL_COMMUNITY): Payer: Self-pay

## 2022-09-07 ENCOUNTER — Other Ambulatory Visit (HOSPITAL_COMMUNITY): Payer: Self-pay

## 2022-09-11 ENCOUNTER — Other Ambulatory Visit (HOSPITAL_COMMUNITY): Payer: Self-pay

## 2022-09-11 ENCOUNTER — Other Ambulatory Visit: Payer: Self-pay

## 2022-09-11 MED ORDER — DOXYCYCLINE HYCLATE 100 MG PO TABS
100.0000 mg | ORAL_TABLET | Freq: Two times a day (BID) | ORAL | 3 refills | Status: DC
  Filled 2022-09-11: qty 60, 30d supply, fill #0
  Filled 2022-11-04: qty 60, 30d supply, fill #1
  Filled 2022-12-20: qty 60, 30d supply, fill #2
  Filled 2023-01-29: qty 60, 30d supply, fill #3

## 2022-09-14 ENCOUNTER — Other Ambulatory Visit (HOSPITAL_COMMUNITY): Payer: Self-pay

## 2022-09-24 DIAGNOSIS — E103213 Type 1 diabetes mellitus with mild nonproliferative diabetic retinopathy with macular edema, bilateral: Secondary | ICD-10-CM | POA: Diagnosis not present

## 2022-09-24 DIAGNOSIS — Z794 Long term (current) use of insulin: Secondary | ICD-10-CM | POA: Diagnosis not present

## 2022-09-24 DIAGNOSIS — E0500 Thyrotoxicosis with diffuse goiter without thyrotoxic crisis or storm: Secondary | ICD-10-CM | POA: Diagnosis not present

## 2022-09-24 DIAGNOSIS — D649 Anemia, unspecified: Secondary | ICD-10-CM | POA: Diagnosis not present

## 2022-09-24 DIAGNOSIS — E1065 Type 1 diabetes mellitus with hyperglycemia: Secondary | ICD-10-CM | POA: Diagnosis not present

## 2022-09-24 DIAGNOSIS — E0590 Thyrotoxicosis, unspecified without thyrotoxic crisis or storm: Secondary | ICD-10-CM | POA: Diagnosis not present

## 2022-09-24 LAB — CBC AND DIFFERENTIAL
HCT: 37 (ref 36–46)
Hemoglobin: 12.2 (ref 12.0–16.0)
Platelets: 208 10*3/uL (ref 150–400)
WBC: 4.8

## 2022-09-24 LAB — HEMOGLOBIN A1C: Hemoglobin A1C: 8.5

## 2022-09-24 LAB — TSH: TSH: 0.5 (ref <=5.90)

## 2022-09-24 LAB — CBC: RBC: 4.7 (ref 3.87–5.11)

## 2022-10-01 ENCOUNTER — Other Ambulatory Visit (HOSPITAL_COMMUNITY): Payer: Self-pay

## 2022-10-05 ENCOUNTER — Other Ambulatory Visit (HOSPITAL_COMMUNITY): Payer: Self-pay

## 2022-10-16 DIAGNOSIS — Z794 Long term (current) use of insulin: Secondary | ICD-10-CM | POA: Diagnosis not present

## 2022-10-16 DIAGNOSIS — E1065 Type 1 diabetes mellitus with hyperglycemia: Secondary | ICD-10-CM | POA: Diagnosis not present

## 2022-10-17 ENCOUNTER — Other Ambulatory Visit (HOSPITAL_COMMUNITY): Payer: Self-pay

## 2022-11-04 ENCOUNTER — Other Ambulatory Visit (HOSPITAL_COMMUNITY): Payer: Self-pay

## 2022-11-27 ENCOUNTER — Other Ambulatory Visit (HOSPITAL_COMMUNITY): Payer: Self-pay

## 2022-12-25 DIAGNOSIS — D649 Anemia, unspecified: Secondary | ICD-10-CM | POA: Diagnosis not present

## 2022-12-25 DIAGNOSIS — E1065 Type 1 diabetes mellitus with hyperglycemia: Secondary | ICD-10-CM | POA: Diagnosis not present

## 2022-12-25 DIAGNOSIS — E103213 Type 1 diabetes mellitus with mild nonproliferative diabetic retinopathy with macular edema, bilateral: Secondary | ICD-10-CM | POA: Diagnosis not present

## 2022-12-25 DIAGNOSIS — E0500 Thyrotoxicosis with diffuse goiter without thyrotoxic crisis or storm: Secondary | ICD-10-CM | POA: Diagnosis not present

## 2022-12-25 DIAGNOSIS — E0590 Thyrotoxicosis, unspecified without thyrotoxic crisis or storm: Secondary | ICD-10-CM | POA: Diagnosis not present

## 2022-12-25 DIAGNOSIS — Z794 Long term (current) use of insulin: Secondary | ICD-10-CM | POA: Diagnosis not present

## 2022-12-25 LAB — CBC AND DIFFERENTIAL
HCT: 37 (ref 36–46)
Hemoglobin: 11.9 — AB (ref 12.0–16.0)
Platelets: 226 10*3/uL (ref 150–400)
WBC: 4

## 2022-12-25 LAB — TSH: TSH: 0.67 (ref 0.41–5.90)

## 2022-12-25 LAB — HEMOGLOBIN A1C: Hemoglobin A1C: 7.9

## 2022-12-25 LAB — CBC: RBC: 4.57 (ref 3.87–5.11)

## 2023-01-05 ENCOUNTER — Telehealth (INDEPENDENT_AMBULATORY_CARE_PROVIDER_SITE_OTHER): Payer: Commercial Managed Care - PPO | Admitting: Family Medicine

## 2023-01-05 ENCOUNTER — Other Ambulatory Visit (HOSPITAL_COMMUNITY): Payer: Self-pay

## 2023-01-05 ENCOUNTER — Encounter: Payer: Self-pay | Admitting: Family Medicine

## 2023-01-05 DIAGNOSIS — F4321 Adjustment disorder with depressed mood: Secondary | ICD-10-CM

## 2023-01-05 DIAGNOSIS — F418 Other specified anxiety disorders: Secondary | ICD-10-CM

## 2023-01-05 DIAGNOSIS — E10311 Type 1 diabetes mellitus with unspecified diabetic retinopathy with macular edema: Secondary | ICD-10-CM

## 2023-01-05 MED ORDER — HYDROXYZINE HCL 10 MG PO TABS
10.0000 mg | ORAL_TABLET | Freq: Two times a day (BID) | ORAL | 0 refills | Status: DC | PRN
  Filled 2023-01-05 – 2023-01-08 (×2): qty 40, 20d supply, fill #0

## 2023-01-05 NOTE — Progress Notes (Signed)
Phone [phone] Virtual visit via Video note   Subjective:  Chief complaint: Chief Complaint  Patient presents with   Follow-up    Pt wants to discuss depression and anxiety, does not wish to be on medication.    This visit type was conducted due to national recommendations for restrictions regarding the COVID-19 Pandemic (e.g. social distancing).  This format is felt to be most appropriate for this patient at this time balancing risks to patient and risks to population by having him in for in person visit.  No physical exam was performed (except for noted visual exam or audio findings with Telehealth visits).    Our team/I connected with Penny Andrade at  4:00 PM EST by a video enabled telemedicine application (doxy.me or caregility through epic) and verified that I am speaking with the correct person using two identifiers.  Location patient: [place] Location provider: [facility], office Persons participating in the virtual visit:  patient  Our team/I discussed the limitations of evaluation and management by telemedicine and the availability of in person appointments. In light of current covid-19 pandemic, patient also understands that we are trying to protect them by minimizing in office contact if at all possible.  The patient expressed consent for telemedicine visit and agreed to proceed. Patient understands insurance will be billed.   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      atorvastatin (LIPITOR) 10 MG tablet Take 10 mg by mouth daily.     Continuous Blood Gluc Sensor (DEXCOM G6 SENSOR) MISC Change sensor every 10 days 9 each 4   Continuous Blood Gluc Transmit (DEXCOM G6 TRANSMITTER) MISC Change transmitter every 90 days 1 each 4   doxycycline (VIBRA-TABS) 100 MG tablet Take 1 tablet (100 mg total) by mouth 2 (two) times daily. 60 tablet 3   Insulin Disposable Pump (OMNIPOD 5 G6 INTRO, GEN 5,) KIT Change pod every 3 days 1 kit 1   Insulin Disposable Pump (OMNIPOD 5 G6 POD, GEN 5,) MISC Change pod every 3 days 30 each 4   Insulin Lispro-aabc (LYUMJEV IJ) Inject as directed.     Insulin Lispro-aabc (LYUMJEV) 100 UNIT/ML SOLN Inject up to 70 Units daily. 20 mL 12   ipratropium (ATROVENT) 0.03 % nasal spray Place 2 sprays into both nostrils every 12 (twelve) hours. 30 mL 0   methimazole (TAPAZOLE) 10 MG tablet Take 5 mg by mouth daily.      MINOXIDIL PO Take 2.5 mg by mouth. 1/2 tablet daily     pantoprazole (PROTONIX) 40 MG tablet Take 1 tablet (40 mg total) by mouth daily. 30 tablet 3   senna-docusate (SENOKOT-S) 8.6-50 MG tablet Take 1 tablet by mouth at bedtime as needed for mild constipation or moderate constipation. (Patient not taking: Reported on 01/05/2023) 30 tablet 0   No current facility-administered medications for this visit.  Objective:  There were no vitals taken for this visit. 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  -last  8 weeks were very challenging. Brothers wife (sister in Social worker) is  currently on ventilator- and family asking a lot of questions of her with her medical background in last 2 weeks  - when she tries to work on papers her concentration is down- assignments would take drastically extended time as a result -has noted lower appetite - also with decreased concentration her anxiety is up -does have fair amount of fatigue -bed 9 30 or 10 and wakes up 5 30 or 6- so decent night of sleep but some tossing and turning  Has reached out to a therapist. Working through her outlets like prayer, her faith. Some time for community over Christmas breaks    01/05/2023    2:55 PM 04/28/2022   10:02 AM 04/09/2021   11:18 AM  Depression screen PHQ 2/9  Decreased Interest 0  0  Down, Depressed, Hopeless 1 2 0  PHQ - 2 Score 1 2 0  Altered sleeping 0 0   Tired, decreased energy 1 3   Change in appetite 2 0   Feeling bad or failure about yourself  0 0   Trouble concentrating 3 0   Moving slowly or fidgety/restless 0 0   Suicidal thoughts 0 0   PHQ-9 Score 7 5   Difficult doing work/chores Somewhat difficult Somewhat difficult   A/P: Situational anxiety/depressed mood -Patient is being proactive about caring about herself-I am thankful she has reached out to her therapist and encouraged her to follow through with this process - Talked about possible buspirone versus hydroxyzine-we opted for trial of hydroxyzine as needed for anxiety  - She prefers not to take daily medication at this time and we discussed trying to build some margin into her life (may have some difficult decisions to make with this)-I think if she does this she likely will not need long-term medication - 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 Sharl Andrade last week S: Medication: Insulin with pump CBGs-uses  Dexcom Lab Results  Component Value Date   HGBA1C 7.9 12/25/2022   HGBA1C 8.5 09/24/2022   HGBA1C 8.1 02/17/2022  A/P: Improving control-encourage follow-up with endocrinology which she is diligent about-continue current medication  Recommended follow up: Keep May visit Future Appointments  Date Time Provider Department Center  04/30/2023  9:00 AM Shelva Majestic, MD [facility]    Lab/Order associations:   ICD-10-CM   1. Situational anxiety  F41.8     2. Situational depression  F43.21     3. 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  Dispense:  40 tablet    Refill:  0    Return precautions advised.  Tana Conch, MD

## 2023-01-06 ENCOUNTER — Other Ambulatory Visit (HOSPITAL_COMMUNITY): Payer: Self-pay

## 2023-01-08 ENCOUNTER — Other Ambulatory Visit (HOSPITAL_COMMUNITY): Payer: Self-pay

## 2023-01-08 ENCOUNTER — Other Ambulatory Visit: Payer: Self-pay

## 2023-01-14 DIAGNOSIS — F411 Generalized anxiety disorder: Secondary | ICD-10-CM | POA: Diagnosis not present

## 2023-01-18 ENCOUNTER — Other Ambulatory Visit (HOSPITAL_COMMUNITY): Payer: Self-pay

## 2023-01-19 DIAGNOSIS — E1065 Type 1 diabetes mellitus with hyperglycemia: Secondary | ICD-10-CM | POA: Diagnosis not present

## 2023-01-28 DIAGNOSIS — F439 Reaction to severe stress, unspecified: Secondary | ICD-10-CM | POA: Diagnosis not present

## 2023-01-28 DIAGNOSIS — F411 Generalized anxiety disorder: Secondary | ICD-10-CM | POA: Diagnosis not present

## 2023-01-29 ENCOUNTER — Other Ambulatory Visit (HOSPITAL_COMMUNITY): Payer: Self-pay

## 2023-01-29 ENCOUNTER — Other Ambulatory Visit: Payer: Self-pay

## 2023-03-05 ENCOUNTER — Other Ambulatory Visit: Payer: Self-pay

## 2023-03-08 ENCOUNTER — Other Ambulatory Visit: Payer: Self-pay

## 2023-03-16 DIAGNOSIS — D649 Anemia, unspecified: Secondary | ICD-10-CM | POA: Diagnosis not present

## 2023-03-16 DIAGNOSIS — E103213 Type 1 diabetes mellitus with mild nonproliferative diabetic retinopathy with macular edema, bilateral: Secondary | ICD-10-CM | POA: Diagnosis not present

## 2023-03-16 DIAGNOSIS — E1065 Type 1 diabetes mellitus with hyperglycemia: Secondary | ICD-10-CM | POA: Diagnosis not present

## 2023-03-16 DIAGNOSIS — Z794 Long term (current) use of insulin: Secondary | ICD-10-CM | POA: Diagnosis not present

## 2023-03-16 DIAGNOSIS — E0500 Thyrotoxicosis with diffuse goiter without thyrotoxic crisis or storm: Secondary | ICD-10-CM | POA: Diagnosis not present

## 2023-03-16 DIAGNOSIS — E0590 Thyrotoxicosis, unspecified without thyrotoxic crisis or storm: Secondary | ICD-10-CM | POA: Diagnosis not present

## 2023-03-16 LAB — HEMOGLOBIN A1C: Hemoglobin A1C: 8.7

## 2023-03-20 ENCOUNTER — Other Ambulatory Visit: Payer: Self-pay | Admitting: Family Medicine

## 2023-03-22 ENCOUNTER — Other Ambulatory Visit (HOSPITAL_COMMUNITY): Payer: Self-pay

## 2023-03-22 MED ORDER — DOXYCYCLINE HYCLATE 100 MG PO TABS
100.0000 mg | ORAL_TABLET | Freq: Two times a day (BID) | ORAL | 3 refills | Status: DC
  Filled 2023-03-22: qty 60, 30d supply, fill #0
  Filled 2023-05-18: qty 60, 30d supply, fill #1
  Filled 2023-09-25: qty 60, 30d supply, fill #2

## 2023-04-03 ENCOUNTER — Other Ambulatory Visit (HOSPITAL_COMMUNITY): Payer: Self-pay

## 2023-04-20 ENCOUNTER — Other Ambulatory Visit (HOSPITAL_COMMUNITY): Payer: Self-pay

## 2023-04-20 ENCOUNTER — Other Ambulatory Visit: Payer: Self-pay

## 2023-04-22 ENCOUNTER — Other Ambulatory Visit: Payer: Self-pay

## 2023-04-30 ENCOUNTER — Ambulatory Visit (INDEPENDENT_AMBULATORY_CARE_PROVIDER_SITE_OTHER): Payer: Commercial Managed Care - PPO | Admitting: Family Medicine

## 2023-04-30 ENCOUNTER — Other Ambulatory Visit: Payer: Self-pay

## 2023-04-30 ENCOUNTER — Encounter: Payer: Self-pay | Admitting: Family Medicine

## 2023-04-30 ENCOUNTER — Other Ambulatory Visit (HOSPITAL_COMMUNITY): Payer: Self-pay

## 2023-04-30 VITALS — BP 138/78 | HR 98 | Temp 97.3°F | Ht 70.0 in | Wt 193.4 lb

## 2023-04-30 DIAGNOSIS — Z1231 Encounter for screening mammogram for malignant neoplasm of breast: Secondary | ICD-10-CM | POA: Diagnosis not present

## 2023-04-30 DIAGNOSIS — E0500 Thyrotoxicosis with diffuse goiter without thyrotoxic crisis or storm: Secondary | ICD-10-CM | POA: Diagnosis not present

## 2023-04-30 DIAGNOSIS — K219 Gastro-esophageal reflux disease without esophagitis: Secondary | ICD-10-CM | POA: Diagnosis not present

## 2023-04-30 DIAGNOSIS — Z79899 Other long term (current) drug therapy: Secondary | ICD-10-CM | POA: Diagnosis not present

## 2023-04-30 DIAGNOSIS — E10311 Type 1 diabetes mellitus with unspecified diabetic retinopathy with macular edema: Secondary | ICD-10-CM

## 2023-04-30 DIAGNOSIS — Z0000 Encounter for general adult medical examination without abnormal findings: Secondary | ICD-10-CM

## 2023-04-30 LAB — COMPREHENSIVE METABOLIC PANEL
ALT: 11 U/L (ref 0–35)
AST: 14 U/L (ref 0–37)
Albumin: 4 g/dL (ref 3.5–5.2)
Alkaline Phosphatase: 52 U/L (ref 39–117)
BUN: 7 mg/dL (ref 6–23)
CO2: 31 mEq/L (ref 19–32)
Calcium: 9.2 mg/dL (ref 8.4–10.5)
Chloride: 102 mEq/L (ref 96–112)
Creatinine, Ser: 0.77 mg/dL (ref 0.40–1.20)
GFR: 90.8 mL/min (ref >60.00)
Glucose, Bld: 224 mg/dL — ABNORMAL HIGH (ref 70–99)
Potassium: 4.6 mEq/L (ref 3.5–5.1)
Sodium: 139 mEq/L (ref 135–145)
Total Bilirubin: 0.8 mg/dL (ref 0.2–1.2)
Total Protein: 6.8 g/dL (ref 6.0–8.3)

## 2023-04-30 LAB — CBC WITH DIFFERENTIAL/PLATELET
Basophils Absolute: 0 10*3/uL (ref 0.0–0.1)
Basophils Relative: 0.6 % (ref 0.0–3.0)
Eosinophils Absolute: 0.1 10*3/uL (ref 0.0–0.7)
Eosinophils Relative: 2.2 % (ref 0.0–5.0)
HCT: 35.9 % — ABNORMAL LOW (ref 36.0–46.0)
Hemoglobin: 11.9 g/dL — ABNORMAL LOW (ref 12.0–15.0)
Lymphocytes Relative: 42.6 % (ref 12.0–46.0)
Lymphs Abs: 1.4 10*3/uL (ref 0.7–4.0)
MCHC: 33.1 g/dL (ref 30.0–36.0)
MCV: 79.2 fl (ref 78.0–100.0)
Monocytes Absolute: 0.3 10*3/uL (ref 0.1–1.0)
Monocytes Relative: 8.1 % (ref 3.0–12.0)
Neutro Abs: 1.5 10*3/uL (ref 1.4–7.7)
Neutrophils Relative %: 46.5 % (ref 43.0–77.0)
Platelets: 223 10*3/uL (ref 150.0–400.0)
RBC: 4.54 Mil/uL (ref 3.87–5.11)
RDW: 14.4 % (ref 11.5–15.5)
WBC: 3.3 10*3/uL — ABNORMAL LOW (ref 4.0–10.5)

## 2023-04-30 LAB — LIPID PANEL
Cholesterol: 176 mg/dL (ref 0–200)
HDL: 72.3 mg/dL (ref >39.00)
LDL Cholesterol: 94 mg/dL (ref 0–99)
NonHDL: 104.13
Total CHOL/HDL Ratio: 2
Triglycerides: 50 mg/dL (ref 0.0–149.0)
VLDL: 10 mg/dL (ref 0.0–40.0)

## 2023-04-30 LAB — VITAMIN B12: Vitamin B-12: 445 pg/mL (ref 211–911)

## 2023-04-30 MED ORDER — HYDROXYZINE HCL 25 MG PO TABS
25.0000 mg | ORAL_TABLET | Freq: Two times a day (BID) | ORAL | 2 refills | Status: DC | PRN
  Filled 2023-04-30: qty 30, 15d supply, fill #0
  Filled 2023-09-25: qty 30, 15d supply, fill #1

## 2023-04-30 MED ORDER — ATORVASTATIN CALCIUM 10 MG PO TABS
10.0000 mg | ORAL_TABLET | Freq: Every day | ORAL | 3 refills | Status: DC
  Filled 2023-04-30: qty 90, 90d supply, fill #0

## 2023-04-30 NOTE — Progress Notes (Signed)
Phone [phone]   Subjective:  Patient presents today for their annual physical. Chief complaint-noted.   See problem oriented charting- ROS- full  review of systems was completed and negative except for: fatigue, shortness of breath, constipation, decreased concentration, anxiety/stress  The following were reviewed and entered/updated in epic: Past Medical History:  Diagnosis Date   Alopecia    CCCA   Diabetes mellitus without complication (HCC)    diagnosed at 47 years old 37. Dr. 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    Multiple myeloma Brother        passed at 80 from this   Breast cancer Paternal Grandmother    Pancreatic cancer Paternal Grandfather     Medications- reviewed and updated Current Outpatient Medications  Medication Sig Dispense Refill   Adapalene-Benzoyl Peroxide 0.1-2.5 % gel APPLY EXTERNALLY TO THE AFFECTED AREA AT BEDTIME 45 g 5   atenolol (TENORMIN) 25 MG tablet Take 25 mg by mouth daily.      atorvastatin (LIPITOR) 10 MG tablet Take 1 tablet (10 mg total) by mouth daily. 90 tablet 3   Continuous Blood Gluc Sensor (DEXCOM G6 SENSOR) MISC Change sensor every 10 days 9 each 4   Continuous Blood Gluc Transmit (DEXCOM G6 TRANSMITTER) MISC Change transmitter every 90 days 1 each 4   doxycycline (VIBRA-TABS) 100 MG tablet  Take 1 tablet (100 mg total) by mouth 2 (two) times daily. 60 tablet 3   Insulin Disposable Pump (OMNIPOD 5 G6 INTRO, GEN 5,) KIT Change pod every 3 days 1 kit 1   Insulin Disposable Pump (OMNIPOD 5 G6 PODS, GEN 5,) MISC Change pod every 3 days 30 each 4   Insulin Lispro-aabc (LYUMJEV IJ) Inject as directed.     Insulin Lispro-aabc (LYUMJEV) 100 UNIT/ML SOLN Inject up to 70 Units daily. 20 mL 12   ipratropium (ATROVENT) 0.03 % nasal spray Place 2 sprays into both nostrils every 12 (twelve) hours. 30 mL 0   methimazole (TAPAZOLE) 10 MG tablet Take 5 mg by mouth daily.      pantoprazole (PROTONIX) 40 MG tablet Take 1 tablet (40 mg total) by mouth daily. 30 tablet 3   hydrOXYzine (ATARAX) 25 MG tablet Take 1 tablet (25 mg total) by mouth 2 (two) times daily as needed. 30 tablet 2   senna-docusate (SENOKOT-S) 8.6-50 MG tablet Take 1 tablet by mouth at bedtime as needed for mild constipation or moderate constipation. (Patient not taking: Reported on 04/30/2023) 30 tablet 0   No current facility-administered medications for this visit.    Allergies-reviewed and updated Allergies  Allergen Reactions   Meloxicam Other (See Comments)    Other Reaction: itchy    Social History   Social History Narrative  Married Sept 2018. Lives with husband. Mom keeps her yorkiepoo. Son is 32 years old in 2018. Patient's husband has disabled son born 51 who is nonverbal from seizure activity- requires full time care.       At [employer] student health started September 2021   Prior Bariatric Care with [facility] right now.    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    Objective  Objective:  BP 138/78   Pulse 98   Temp (!) 97.3 F (36.3 C)   Ht 5\' 10"  (1.778 m)   Wt 193 lb 6.4 oz (87.7  kg)   SpO2 98%   BMI 27.75 kg/m  Gen: NAD, resting comfortably HEENT: Mucous membranes are moist. Oropharynx normal Neck: no thyromegaly CV: RRR no murmurs rubs or gallops Lungs: CTAB no crackles, wheeze, rhonchi Abdomen: soft/nontender/nondistended/normal bowel sounds. No rebound or guarding.  Ext: no edema Skin: warm, dry Neuro: grossly normal, moves all extremities, PERRLA  Diabetic Foot Exam - Simple   Simple Foot Form Diabetic Foot exam was performed with the following findings: Yes 04/30/2023  9:36 AM  Visual Inspection No deformities, no ulcerations, no other skin breakdown bilaterally: Yes Sensation Testing Intact to touch and monofilament testing bilaterally: Yes Pulse Check Posterior Tibialis and Dorsalis pulse intact bilaterally: Yes Comments      Assessment and Plan   47 y.o. female presenting for annual physical.  Health Maintenance counseling: 1. Anticipatory guidance: Patient counseled regarding regular dental exams -q6 months, eye exams -sees [facility] yearly- needs to schedule,  avoiding smoking and second hand smoke , limiting alcohol to 1 beverage per day-1 per month , no illicit drugs .   2. Risk factor reduction:  Advised patient of need for regular exercise and diet rich and fruits and vegetables to reduce risk of heart attack and stroke.  Exercise- -tough with work schedule and school- hoping to restart as things settle for her.  Diet/weight management-weight down 2 lbs in last year- particularly impressive with her stress. Reports using phentermine- did discuss higher blood pressure likely related to this and high normal heart rate  Wt Readings from Last 3 Encounters:  04/30/23 193 lb 6.4 oz (87.7 kg)  04/28/22 195 lb (88.5 kg)  04/09/21 197 lb 3.2 oz (89.4 kg)  3. Immunizations/screenings/ancillary studies- declines COVID shot for now Immunization History  Administered Date(s) Administered   Influenza Split 11/06/2016, 09/27/2018    Influenza,inj,Quad PF,6+ Mos 10/13/2019, 10/04/2020, 10/01/2022   Influenza-Unspecified 09/27/2017, 10/07/2018   Moderna Sars-Covid-2 Vaccination 11/01/2020   PFIZER(Purple Top)SARS-COV-2 Vaccination 12/30/2019, 01/20/2020   PPD Test 01/05/2018, 04/12/2019, 04/29/2019, 05/22/2020   Pneumococcal Polysaccharide-23 09/09/2016   Tdap 12/09/2010, 02/07/2019  4. Cervical cancer screening- pap smear 02/07/19 with 5 year repeat planned with our office-  [facility] Dr[facility] Langston Masker - did have some heavy cramping/bleeding and saw Dr. Langston Masker with [facility]- polypoid cyst removed 2023 and follow up everything was stable- no recurrent issues  5. Breast cancer screening-  breast exam -with gyn- and mammogram 07/21/21 with 1 year repeat planned- due now- refer today 6. Colon cancer screening -  cologuard negative 06/01/22 with 3 year repeat  7. Skin cancer screening--saw dermatology for alopecia in past- ended up stopping treatment. Lower  risk for skin  cancer. advised regular sunscreen use. Denies worrisome, changing, or new skin lesions.  8. Birth control/STD check- no birth control.  Has had fertility issues. No concern for STDs. Thinks perimenopausal still.   9. Osteoporosis screening at 25- consider post menopause- or at least at 61 -never smoker  Status of chronic or acute concerns   #ROS note- has noted some shortness of breath with stairs at her home- no cough/congestion with it. Some seasonal allergies but not overbearing. No edema.  - wants to do basic labs and hold off on chest xray or EKG - she will let us know if worsens - for now wants to work on progressively increasing exercise  # Diabetes type I-follows with Dr. Sharl Ma S: Medication:Insulin pump through endocrinology CBGs- monitors with Dexcom A/P:  last a1c up to 8.7 on 03/16/23. CBC and LFT normal as well  # Graves' disease-follows with Dr. Sharl Ma S: Medication: Atenolol 10 mg daily, methimazole 10 mg daily   A/P:  reports stable on labs- continue current medications and endo follow up     #hyperlipidemia S: Medication:Atorvastatin 10 mg daily Lab Results  Component Value Date   CHOL 183 04/28/2022   HDL 83.60 04/28/2022   LDLCALC 92 04/28/2022   TRIG 41.0 04/28/2022   CHOLHDL 2 04/28/2022  A/P: update lipids- likely tolerate as long as LDL under 100 though ideal would be under 70  # Situational anxiety S: Medication: In January 2024 we opted to trial hydroxyzine as needed for anxiety- over 4 months went through 40 tablets with taking 2 at a time.  Also considered buspirone.  She did not want to take a daily medicine at that time. - Loss of brother to multiple myeloma in the past likely contributes -Sister-in-law was very ill early 2024- ended up passing -mother in law passed around same time unfortunately   - school stress was bothersome- settled more  - Stress as a healthcare provider likely contributes -Had reached out to therapist- was not best fit and plans to look into a different therapist - still some benefit A/P: PHQ9 scores down to 5 and no SI and generalized anxiety disorder 7 scores at 8 today- she feels much more tolerable- wants to hold off on any medicaiton changes   -refill hydroxyzine but adjust dose to 25 mg   # GERD S:Medication: Pantoprazole 40 mg just as needed now- once or twice a month A/P: offered to reduce dose- she wants to hold steady     # Acne-adapalene-benzyl peroxide at bedtime. Also has been on doxycycline recently- she has noted benefit and was able to go down to once at night right now   Recommended follow up: Return in about 1 year (around 04/29/2024) for physical or sooner if needed.Schedule b4 you leave.  Lab/Order associations: fasting   ICD-10-CM   1. Preventative health care  Z00.00 Vitamin B12    Comprehensive metabolic panel    CBC with Differential/Platelet    Lipid panel    2. 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 Graves disease  E05.00     4. Gastroesophageal reflux disease, unspecified whether esophagitis present  K21.9     5. Encounter for screening mammogram for malignant neoplasm of breast  Z12.31 MM Digital Screening    6. High risk medication use  Z79.899 Vitamin B12      Meds ordered this encounter  Medications   hydrOXYzine (ATARAX) 25 MG tablet    Sig: Take 1 tablet (25 mg total) by mouth 2 (two) times daily as needed.    Dispense:  30 tablet    Refill:  2   atorvastatin (LIPITOR) 10 MG tablet    Sig: Take 1 tablet (10 mg total) by mouth daily.    Dispense:  90 tablet    Refill:  3    Return precautions advised.  Tana Conch, MD

## 2023-04-30 NOTE — Patient Instructions (Addendum)
Let us know if you get any other COVID vaccines.  [facility] Schedule an appointment by calling [phone].  Please stop by lab before you go If you have mychart- we will send your results within 3 business days of [place] receiving them.  If you do not have mychart- we will call you about results within 5 business days of [place] receiving them.  *please also note that you will see labs on mychart as soon as they post. I will later go in and write notes on them- will say "notes from Dr. Durene Cal"

## 2023-05-07 ENCOUNTER — Emergency Department (HOSPITAL_BASED_OUTPATIENT_CLINIC_OR_DEPARTMENT_OTHER): Payer: Commercial Managed Care - PPO

## 2023-05-07 ENCOUNTER — Other Ambulatory Visit: Payer: Self-pay

## 2023-05-07 ENCOUNTER — Emergency Department (HOSPITAL_BASED_OUTPATIENT_CLINIC_OR_DEPARTMENT_OTHER)
Admission: EM | Admit: 2023-05-07 | Discharge: 2023-05-07 | Disposition: A | Discharge status: Home / Self Care | Payer: Commercial Managed Care - PPO | Attending: Emergency Medicine | Admitting: Emergency Medicine

## 2023-05-07 ENCOUNTER — Encounter (HOSPITAL_BASED_OUTPATIENT_CLINIC_OR_DEPARTMENT_OTHER): Payer: Self-pay | Admitting: Emergency Medicine

## 2023-05-07 ENCOUNTER — Other Ambulatory Visit (HOSPITAL_BASED_OUTPATIENT_CLINIC_OR_DEPARTMENT_OTHER): Payer: Self-pay

## 2023-05-07 DIAGNOSIS — E119 Type 2 diabetes mellitus without complications: Secondary | ICD-10-CM | POA: Insufficient documentation

## 2023-05-07 DIAGNOSIS — R0602 Shortness of breath: Secondary | ICD-10-CM | POA: Diagnosis not present

## 2023-05-07 DIAGNOSIS — Z794 Long term (current) use of insulin: Secondary | ICD-10-CM | POA: Diagnosis not present

## 2023-05-07 LAB — CBC WITH DIFFERENTIAL/PLATELET
Abs Immature Granulocytes: 0.02 10*3/uL (ref 0.00–0.07)
Basophils Absolute: 0 10*3/uL (ref 0.0–0.1)
Basophils Relative: 0 %
Eosinophils Absolute: 0 10*3/uL (ref 0.0–0.5)
Eosinophils Relative: 1 %
HCT: 41.2 % (ref 36.0–46.0)
Hemoglobin: 13.2 g/dL (ref 12.0–15.0)
Immature Granulocytes: 1 %
Lymphocytes Relative: 27 %
Lymphs Abs: 1.2 10*3/uL (ref 0.7–4.0)
MCH: 25.7 pg — ABNORMAL LOW (ref 26.0–34.0)
MCHC: 32 g/dL (ref 30.0–36.0)
MCV: 80.3 fL (ref 80.0–100.0)
Monocytes Absolute: 0.3 10*3/uL (ref 0.1–1.0)
Monocytes Relative: 6 %
Neutro Abs: 2.8 10*3/uL (ref 1.7–7.7)
Neutrophils Relative %: 65 %
Platelets: 226 10*3/uL (ref 150–400)
RBC: 5.13 MIL/uL — ABNORMAL HIGH (ref 3.87–5.11)
RDW: 13.2 % (ref 11.5–15.5)
WBC: 4.2 10*3/uL (ref 4.0–10.5)
nRBC: 0 % (ref 0.0–0.2)

## 2023-05-07 LAB — BASIC METABOLIC PANEL
Anion gap: 9 (ref 5–15)
BUN: 10 mg/dL (ref 6–20)
CO2: 26 mmol/L (ref 22–32)
Calcium: 9.7 mg/dL (ref 8.9–10.3)
Chloride: 104 mmol/L (ref 98–111)
Creatinine, Ser: 0.89 mg/dL (ref 0.44–1.00)
GFR, Estimated: >60 mL/min (ref >60)
Glucose, Bld: 185 mg/dL — ABNORMAL HIGH (ref 70–99)
Potassium: 3.8 mmol/L (ref 3.5–5.1)
Sodium: 139 mmol/L (ref 135–145)

## 2023-05-07 LAB — D-DIMER, QUANTITATIVE: D-Dimer, Quant: <0.27 ug/mL-FEU (ref 0.00–0.50)

## 2023-05-07 LAB — TROPONIN I (HIGH SENSITIVITY): Troponin I (High Sensitivity): <2 ng/L (ref <18)

## 2023-05-07 LAB — BRAIN NATRIURETIC PEPTIDE: B Natriuretic Peptide: 6 pg/mL (ref 0.0–100.0)

## 2023-05-07 NOTE — Discharge Instructions (Signed)
Lab work today is unremarkable.  I feel confident that you are not having a heart attack or blood clot.  I do recommend maybe get an outpatient echocardiogram of your heart and may be wearing a cardiac monitor.  Follow-up with your primary care doctor, I have also referred you to cardiology.

## 2023-05-07 NOTE — ED Triage Notes (Signed)
Pt arrived POV c/o SOB x2 weeks, primarily on exertion. Pt reports this morning when ambulating to the shower she passed out, husband was present stating pt was unconscious for a few seconds, pt did not fall or hit her head. Denies SOB at present while at rest. Lung sounds clear and equal. Denies hx DVT/PE. Pt states she checked her CBG when syncope occurred this morning and it was normal at that time.

## 2023-05-07 NOTE — ED Provider Notes (Signed)
[place] EMERGENCY DEPARTMENT AT [facility] Provider Note   CSN: [id number] Arrival date & time: 05/07/23  1041     History  Chief Complaint  Patient presents with   Shortness of Breath    Penny Andrade is a 47 y.o. female.  Patient here with shortness of breath with exertion here the last few weeks.  She had another episode today where she got short of breath after standing up and walking to the bathroom.  Sounds like she got weak in sweaty and her husband was able to direct her into the bedroom to get back on the bed.  She is not quite sure if she lost consciousness but may be for a few seconds.  She has a history of diabetes but no other medical problems.  She has some blood work done last week her primary care doctor.  She has no smoking history.  Denies any black or bloody stools.  Denies any heavy vaginal bleeding.  No recent surgery or travel.  No blood clot history in the family.  No CAD history in the family.  She is not been having any chest pain.  No leg swelling.  The history is provided by the patient.       Home Medications Prior to Admission medications   Medication Sig Start Date End Date Taking? Authorizing Provider  phentermine (ADIPEX-P) 37.5 MG tablet Take 37.5 mg by mouth daily. 04/19/23  Yes [provider]  Adapalene-Benzoyl Peroxide 0.1-2.5 % gel APPLY EXTERNALLY TO THE AFFECTED AREA AT BEDTIME 08/26/20   Shelva Majestic, MD  atenolol (TENORMIN) 25 MG tablet Take 25 mg by mouth daily.  12/19/18   [provider]  atorvastatin (LIPITOR) 10 MG tablet Take 1 tablet (10 mg total) by mouth daily. 04/30/23   Shelva Majestic, MD  Continuous Blood Gluc Sensor (DEXCOM G6 SENSOR) MISC Change sensor every 10 days 05/06/22   Talmage Coin, MD  Continuous Blood Gluc Transmit (DEXCOM G6 TRANSMITTER) MISC Change transmitter every 90 days 05/06/22   Talmage Coin, MD  doxycycline (VIBRA-TABS) 100 MG tablet Take 1 tablet (100 mg total) by  mouth 2 (two) times daily. 03/22/23   Shelva Majestic, MD  hydrOXYzine (ATARAX) 25 MG tablet Take 1 tablet (25 mg total) by mouth 2 (two) times daily as needed. 04/30/23   Shelva Majestic, MD  Insulin Disposable Pump (OMNIPOD 5 G6 INTRO, GEN 5,) KIT Change pod every 3 days 05/06/22   Talmage Coin, MD  Insulin Disposable Pump (OMNIPOD 5 G6 PODS, GEN 5,) MISC Change pod every 3 days 05/06/22   Talmage Coin, MD  Insulin Lispro-aabc (LYUMJEV IJ) Inject as directed.    [provider]  Insulin Lispro-aabc (LYUMJEV) 100 UNIT/ML SOLN Inject up to 70 Units daily. 05/06/22     ipratropium (ATROVENT) 0.03 % nasal spray Place 2 sprays into both nostrils every 12 (twelve) hours. 12/28/21   Margaretann Loveless, PA-C  methimazole (TAPAZOLE) 10 MG tablet Take 5 mg by mouth daily.  12/23/18   [provider]  pantoprazole (PROTONIX) 40 MG tablet Take 1 tablet (40 mg total) by mouth daily. 10/29/20   Shelva Majestic, MD  senna-docusate (SENOKOT-S) 8.6-50 MG tablet Take 1 tablet by mouth at bedtime as needed for mild constipation or moderate constipation. Patient not taking: Reported on 04/30/2023 06/01/20   Long, Arlyss Repress, MD      Allergies    Meloxicam    Review of Systems   Review of  Systems  Physical Exam Updated Vital Signs BP (!) 128/91   Pulse (!) 115   Temp 98.3 F (36.8 C) (Oral)   Resp 20   Ht 5\' 11"  (1.803 m)   Wt 86.2 kg   LMP 04/23/2023 (Approximate)   SpO2 100%   BMI 26.50 kg/m  Physical Exam Vitals and nursing note reviewed.  Constitutional:      General: She is not in acute distress.    Appearance: She is well-developed. She is not ill-appearing.  HENT:     Head: Normocephalic and atraumatic.     Nose: Nose normal.     Mouth/Throat:     Mouth: Mucous membranes are moist.  Eyes:     Extraocular Movements: Extraocular movements intact.     Conjunctiva/sclera: Conjunctivae normal.     Pupils: Pupils are equal, round, and reactive to light.  Cardiovascular:      Rate and Rhythm: Normal rate and regular rhythm.     Pulses: Normal pulses.     Heart sounds: Normal heart sounds. No murmur heard. Pulmonary:     Effort: Pulmonary effort is normal. No respiratory distress.     Breath sounds: Normal breath sounds.  Abdominal:     Palpations: Abdomen is soft.     Tenderness: There is no abdominal tenderness.  Musculoskeletal:        General: No swelling.     Cervical back: Normal range of motion and neck supple.  Skin:    General: Skin is warm and dry.     Capillary Refill: Capillary refill takes less than 2 seconds.  Neurological:     General: No focal deficit present.     Mental Status: She is alert and oriented to person, place, and time.     Cranial Nerves: No cranial nerve deficit.     Sensory: No sensory deficit.     Motor: No weakness.     Coordination: Coordination normal.  Psychiatric:        Mood and Affect: Mood normal.     ED Results / Procedures / Treatments   Labs (all labs ordered are listed, but only abnormal results are displayed) Labs Reviewed  CBC WITH DIFFERENTIAL/PLATELET - Abnormal; Notable for the following components:      Result Value   RBC 5.13 (*)    MCH 25.7 (*)    All other components within normal limits  BASIC METABOLIC PANEL - Abnormal; Notable for the following components:   Glucose, Bld 185 (*)    All other components within normal limits  BRAIN NATRIURETIC PEPTIDE  D-DIMER, QUANTITATIVE  TROPONIN I (HIGH SENSITIVITY)    EKG EKG Interpretation  Date/Time:  Friday May 07 2023 11:08:01 EDT Ventricular Rate:  94 PR Interval:  171 QRS Duration: 81 QT Interval:  344 QTC Calculation: 431 R Axis:   58 Text Interpretation: Sinus rhythm Confirmed by Virgina Norfolk ([id number]) on 05/07/2023 12:08:34 PM  Radiology DG Chest Portable 1 View  Result Date: 05/07/2023 CLINICAL DATA:  Shortness of breath. EXAM: PORTABLE CHEST 1 VIEW COMPARISON:  None Available. FINDINGS: Clear lungs. Normal heart size and  mediastinal contours. No pleural effusion or pneumothorax. Visualized bones and upper abdomen are unremarkable. IMPRESSION: No evidence of acute cardiopulmonary disease. Electronically Signed   By: Orvan Falconer M.D.   On: 05/07/2023 11:34    Procedures Procedures    Medications Ordered in ED Medications - No data to display  ED Course/ Medical Decision Making/ A&P  Medical Decision Making Amount and/or Complexity of Data Reviewed Labs: ordered. Radiology: ordered.   Malk Busbee Leath-Warren is here with shortness of breath.  Normal vitals except for mild tachycardia.  History of diabetes.  Has been having some exertional shortness of breath the last few weeks.  Had blood work done with primary care doctor last week for this which were unremarkable.  She had another kind of episode today where she got really short of breath and like she is going to pass out and was helped to the ground by her husband.  Maybe she lost consciousness briefly.  No seizure-like activity.  She is at her baseline now.  Differential diagnosis includes PE/vasovagal type event, anxiety, less likely ACS, heart failure, anemia.  Will check CBC, CMP, BNP, troponin, D-dimer, BNP and chest x-ray.  EKG shows sinus tachycardic per my review and interpretation.  No ischemic changes.  No signs of volume overload on exam.  No smoking history.  Doubt reactive airway process or infectious process.  Per my review and interpretation the labs is no significant anemia or electrolyte abnormality or kidney injury or leukocytosis.  D-dimer and troponin are normal.  Chest x-ray per my review and interpretation shows no evidence of pneumonia or pneumothorax.  BNP is normal.  I have no concern for heart failure or volume overload.  Overall I do suspect that this could be vasovagal related or may be stress related but I think she would benefit from an echocardiogram and may be a Holter monitor outpatient as well.   Will refer her to cardiology if she is unable to get this done with her primary care doctor.  Discharged in good condition.  This chart was dictated using voice recognition software.  Despite best efforts to proofread,  errors can occur which can change the documentation meaning.         Final Clinical Impression(s) / ED Diagnoses Final diagnoses:  SOB (shortness of breath)    Rx / DC Orders ED Discharge Orders          Ordered    Ambulatory referral to Cardiology       Comments: If you have not heard from the Cardiology office within the next 72 hours please call [phone].   05/07/23 1248              Virgina Norfolk, DO 05/07/23 1249

## 2023-05-10 ENCOUNTER — Other Ambulatory Visit (HOSPITAL_COMMUNITY): Payer: Self-pay

## 2023-05-11 ENCOUNTER — Other Ambulatory Visit: Payer: Self-pay

## 2023-05-11 ENCOUNTER — Other Ambulatory Visit (HOSPITAL_COMMUNITY): Payer: Self-pay

## 2023-05-11 MED ORDER — LYUMJEV 100 UNIT/ML IJ SOLN
70.0000 [IU] | Freq: Every day | INTRAMUSCULAR | 11 refills | Status: DC
  Filled 2023-05-11: qty 20, 28d supply, fill #0
  Filled 2023-06-06: qty 20, 28d supply, fill #1
  Filled 2024-01-18: qty 20, 28d supply, fill #2

## 2023-05-17 ENCOUNTER — Other Ambulatory Visit (HOSPITAL_BASED_OUTPATIENT_CLINIC_OR_DEPARTMENT_OTHER): Payer: Self-pay

## 2023-05-18 ENCOUNTER — Other Ambulatory Visit: Payer: Self-pay

## 2023-05-19 ENCOUNTER — Ambulatory Visit
Admission: RE | Admit: 2023-05-19 | Discharge: 2023-05-19 | Disposition: A | Discharge status: Home / Self Care | Payer: Commercial Managed Care - PPO | Source: Ambulatory Visit | Attending: Family Medicine | Admitting: Family Medicine

## 2023-05-19 DIAGNOSIS — Z1231 Encounter for screening mammogram for malignant neoplasm of breast: Secondary | ICD-10-CM | POA: Diagnosis not present

## 2023-06-07 ENCOUNTER — Other Ambulatory Visit: Payer: Self-pay

## 2023-06-11 ENCOUNTER — Other Ambulatory Visit (HOSPITAL_COMMUNITY): Payer: Self-pay

## 2023-06-21 ENCOUNTER — Encounter: Payer: Self-pay | Admitting: Pharmacist

## 2023-06-21 ENCOUNTER — Other Ambulatory Visit (HOSPITAL_COMMUNITY): Payer: Self-pay

## 2023-06-21 ENCOUNTER — Other Ambulatory Visit: Payer: Self-pay

## 2023-06-21 DIAGNOSIS — Z794 Long term (current) use of insulin: Secondary | ICD-10-CM | POA: Diagnosis not present

## 2023-06-21 DIAGNOSIS — E0500 Thyrotoxicosis with diffuse goiter without thyrotoxic crisis or storm: Secondary | ICD-10-CM | POA: Diagnosis not present

## 2023-06-21 DIAGNOSIS — E1065 Type 1 diabetes mellitus with hyperglycemia: Secondary | ICD-10-CM | POA: Diagnosis not present

## 2023-06-21 DIAGNOSIS — Z9189 Other specified personal risk factors, not elsewhere classified: Secondary | ICD-10-CM | POA: Diagnosis not present

## 2023-06-21 DIAGNOSIS — D649 Anemia, unspecified: Secondary | ICD-10-CM | POA: Diagnosis not present

## 2023-06-21 DIAGNOSIS — R002 Palpitations: Secondary | ICD-10-CM | POA: Diagnosis not present

## 2023-06-21 DIAGNOSIS — E0590 Thyrotoxicosis, unspecified without thyrotoxic crisis or storm: Secondary | ICD-10-CM | POA: Diagnosis not present

## 2023-06-21 DIAGNOSIS — E103213 Type 1 diabetes mellitus with mild nonproliferative diabetic retinopathy with macular edema, bilateral: Secondary | ICD-10-CM | POA: Diagnosis not present

## 2023-06-21 MED ORDER — LYUMJEV 100 UNIT/ML IJ SOLN
70.0000 [IU] | Freq: Every day | INTRAMUSCULAR | 4 refills | Status: DC
  Filled 2023-06-21: qty 70, 90d supply, fill #0
  Filled 2023-07-02: qty 60, 85d supply, fill #0
  Filled 2023-09-25: qty 60, 85d supply, fill #1
  Filled 2024-01-18: qty 60, 85d supply, fill #2
  Filled 2024-05-17: qty 60, 85d supply, fill #3

## 2023-06-21 MED ORDER — ATORVASTATIN CALCIUM 10 MG PO TABS: 10.0000 mg | ORAL_TABLET | Freq: Every day | ORAL | 4 refills | Status: AC

## 2023-06-21 MED ORDER — OMNIPOD 5 DEXG7G6 PODS GEN 5 MISC
1.0000 | 4 refills | Status: DC
  Filled 2023-06-21 – 2023-07-02 (×3): qty 30, 90d supply, fill #0
  Filled 2023-09-25: qty 30, 90d supply, fill #1
  Filled 2024-01-18 – 2024-01-26 (×3): qty 30, 90d supply, fill #2
  Filled 2024-04-20: qty 30, 90d supply, fill #3

## 2023-06-21 MED ORDER — DEXCOM G6 SENSOR MISC
4 refills | Status: DC
  Filled 2023-06-21: qty 9, 90d supply, fill #0
  Filled 2023-09-25: qty 9, 90d supply, fill #1
  Filled 2024-01-26: qty 3, 30d supply, fill #2

## 2023-06-21 MED ORDER — METHIMAZOLE 10 MG PO TABS
20.0000 mg | ORAL_TABLET | Freq: Every day | ORAL | 4 refills | Status: DC
  Filled 2023-06-21: qty 180, 90d supply, fill #0

## 2023-06-22 ENCOUNTER — Other Ambulatory Visit (HOSPITAL_COMMUNITY): Payer: Self-pay

## 2023-06-22 ENCOUNTER — Other Ambulatory Visit: Payer: Self-pay

## 2023-06-22 MED ORDER — GUARDIAN TRANSMITTER MISC
4 refills | Status: DC
  Filled 2023-06-22: qty 1, 90d supply, fill #0

## 2023-06-29 DIAGNOSIS — R002 Palpitations: Secondary | ICD-10-CM | POA: Diagnosis not present

## 2023-06-29 DIAGNOSIS — D649 Anemia, unspecified: Secondary | ICD-10-CM | POA: Diagnosis not present

## 2023-06-29 DIAGNOSIS — E0590 Thyrotoxicosis, unspecified without thyrotoxic crisis or storm: Secondary | ICD-10-CM | POA: Diagnosis not present

## 2023-06-29 DIAGNOSIS — Z9189 Other specified personal risk factors, not elsewhere classified: Secondary | ICD-10-CM | POA: Diagnosis not present

## 2023-06-29 DIAGNOSIS — E1065 Type 1 diabetes mellitus with hyperglycemia: Secondary | ICD-10-CM | POA: Diagnosis not present

## 2023-06-30 ENCOUNTER — Other Ambulatory Visit (HOSPITAL_COMMUNITY): Payer: Self-pay

## 2023-07-02 ENCOUNTER — Other Ambulatory Visit (HOSPITAL_COMMUNITY): Payer: Self-pay

## 2023-07-02 ENCOUNTER — Other Ambulatory Visit: Payer: Self-pay

## 2023-07-02 ENCOUNTER — Encounter (HOSPITAL_COMMUNITY): Payer: Self-pay

## 2023-07-06 ENCOUNTER — Other Ambulatory Visit (HOSPITAL_COMMUNITY): Payer: Self-pay

## 2023-08-16 ENCOUNTER — Telehealth: Payer: Commercial Managed Care - PPO | Admitting: Physician Assistant

## 2023-08-16 DIAGNOSIS — R3989 Other symptoms and signs involving the genitourinary system: Secondary | ICD-10-CM | POA: Diagnosis not present

## 2023-08-16 MED ORDER — NITROFURANTOIN MONOHYD MACRO 100 MG PO CAPS
100.0000 mg | ORAL_CAPSULE | Freq: Two times a day (BID) | ORAL | 0 refills | Status: DC
Start: 2023-08-16 — End: 2023-10-29

## 2023-08-16 NOTE — Progress Notes (Signed)

## 2023-08-27 ENCOUNTER — Encounter (HOSPITAL_BASED_OUTPATIENT_CLINIC_OR_DEPARTMENT_OTHER): Payer: Self-pay | Admitting: Cardiovascular Disease

## 2023-08-27 ENCOUNTER — Ambulatory Visit (HOSPITAL_BASED_OUTPATIENT_CLINIC_OR_DEPARTMENT_OTHER): Payer: Commercial Managed Care - PPO | Admitting: Cardiovascular Disease

## 2023-08-27 VITALS — BP 110/70 | HR 84 | Ht 71.0 in | Wt 194.0 lb

## 2023-08-27 DIAGNOSIS — E10311 Type 1 diabetes mellitus with unspecified diabetic retinopathy with macular edema: Secondary | ICD-10-CM | POA: Diagnosis not present

## 2023-08-27 DIAGNOSIS — R0609 Other forms of dyspnea: Secondary | ICD-10-CM | POA: Diagnosis not present

## 2023-08-27 DIAGNOSIS — R0789 Other chest pain: Secondary | ICD-10-CM | POA: Diagnosis not present

## 2023-08-27 DIAGNOSIS — R55 Syncope and collapse: Secondary | ICD-10-CM | POA: Diagnosis not present

## 2023-08-27 DIAGNOSIS — Z794 Long term (current) use of insulin: Secondary | ICD-10-CM

## 2023-08-27 HISTORY — DX: Syncope and collapse: R55

## 2023-08-27 HISTORY — DX: Other chest pain: R07.89

## 2023-08-27 HISTORY — DX: Other forms of dyspnea: R06.09

## 2023-08-27 NOTE — Progress Notes (Signed)
Cardiology Office Note:  .   Date:  08/27/2023  ID:  Penny Andrade, DOB December 11, 1976, MRN [id number] PCP: Shelva Majestic, MD  [facility] Providers Cardiologist:  None    History of Present Illness: .    Penny Andrade is a 47 y.o. female with DM Type 1, Graves' disease, hyperlipidemia, and anxiety here for shortness of breath. 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   [name] notes intermittent episodes of shortness of breath, which had been improving recently. The most severe episode occurred in May, when the patient experienced sudden dyspnea, diaphoresis, and a blackout while preparing for work. This episode led to an ER visit, but all tests returned normal. The patient reported that exertion, such as climbing stairs, previously caused significant fatigue, but this has been improving. The patient also reported occasional chest discomfort at rest over the past three months.  The patient has a history of Graves' disease and Type 1 diabetes. The patient's Graves' disease is currently managed with methimazole, which the patient does not take daily due to improving thyroid function. The patient also has a prescription for atenolol, which has not been taken for several months. The patient reported a weight loss of approximately 10-15 pounds, which was not attributed to any specific cause.  The patient's family history includes atrial fibrillation and hypertension in the father, and a history of stroke in the father and an uncle. The patient does not smoke and consumes alcohol infrequently. The patient reported a sedentary lifestyle due to school commitments but has been trying to increase physical activity, including walking the dog and using a treadmill.   [name] husband, who is a Building control surveyor, reported observing shallow breathing and unsteadiness during the severe episode in May. The partner also noted that the patient's physical strength and activity  levels have been improving recently. The partner emphasized the importance of a healthy diet and regular exercise in managing the patient's health.      ROS:  +pre-menopausal  Studies Reviewed: Marland Kitchen       EKG 05/07/23: Sinus rhythm.  Rate 94 bpm  Risk Assessment/Calculations:             Physical Exam:    VS:  BP 110/70 (BP Location: Left Arm, Patient Position: Sitting, Cuff Size: Normal)   Pulse 84   Ht 5\' 11"  (1.803 m)   Wt 194 lb (88 kg)   BMI 27.06 kg/m  , BMI Body mass index is 27.06 kg/m. GENERAL:  Well appearing HEENT: Pupils equal round and reactive, fundi not visualized, oral mucosa unremarkable NECK:  No jugular venous distention, waveform within normal limits, carotid upstroke brisk and symmetric, no bruits, no thyromegaly LUNGS:  Clear to auscultation bilaterally HEART:  RRR.  PMI not displaced or sustained,S1 and S2 within normal limits, no S3, no S4, no clicks, no rubs, no murmurs ABD:  Flat, positive bowel sounds normal in frequency in pitch, no bruits, no rebound, no guarding, no midline pulsatile mass, no hepatomegaly, no splenomegaly EXT:  2 plus pulses throughout, no edema, no cyanosis no clubbing SKIN:  No rashes no nodules NEURO:  Cranial nerves II through XII grossly intact, motor grossly intact throughout PSYCH:  Cognitively intact, oriented to person place and time  ASSESSMENT AND PLAN: .    # Syncope/presyncope:  # Shortness of Breath History of intermittent episodes of shortness of breath, with one severe episode leading to syncope. Episodes have improved recently. No associated chest pain, cough,  or leg swelling. Possible low blood pressure contributing to symptoms. -Order exercise Myoview to evaluate for possible cardiac etiology.  Avoid coronary CT-A given her Graves disease. -Order calcium score to assess for presence of coronary artery plaque.  # Type 1 Diabetes Well-controlled with no recent complications. -Continue current management.  #  Graves' Disease Well-controlled with methimazole.  Only intermittently using this and atenolol.  -Continue f/u with Dr. Sharl Ma  # Hyperlipidemia:  Previously on statin.  LDL 94.  Getting calcium score as above to determine lipid goals.   General Health Maintenance -Encourage regular exercise and healthy diet for cardiovascular health. -Follow-up in 1 year to reassess cardiovascular risk.         Informed Consent   Shared Decision Making/Informed Consent The risks [chest pain, shortness of breath, cardiac arrhythmias, dizziness, blood pressure fluctuations, myocardial infarction, stroke/transient ischemic attack, nausea, vomiting, allergic reaction, radiation exposure, metallic taste sensation and life-threatening complications (estimated to be 1 in 10,000)], benefits (risk stratification, diagnosing coronary artery disease, treatment guidance) and alternatives of a nuclear stress test were discussed in detail with [name] and she agrees to proceed.        Dispo: f/u 1 year  Signed, Chilton Si, MD

## 2023-08-27 NOTE — Patient Instructions (Signed)
Medication Instructions:  Your physician recommends that you continue on your current medications as directed. Please refer to the Current Medication list given to you today.  *If you need a refill on your cardiac medications before your next appointment, please call your pharmacy*  Lab Work: NONE If you have labs (blood work) drawn today and your tests are completely normal, you will receive your results only by: MyChart Message (if you have MyChart) OR A paper copy in the mail If you have any lab test that is abnormal or we need to change your treatment, we will call you to review the results.  Testing/Procedures: Your physician has requested that you have en exercise stress myoview. For further information please visit [email or web address] Please follow instruction sheet, as given.  CALCIUM SCORE - THIS WILL COST YOU $99 OUT OF POCKET   Follow-Up: At [facility], you and your health needs are our priority.  As part of our continuing mission to provide you with exceptional heart care, we have created designated Provider Care Teams.  These Care Teams include your primary Cardiologist (physician) and Advanced Practice Providers (APPs -  Physician Assistants and Nurse Practitioners) who all work together to provide you with the care you need, when you need it.  We recommend signing up for the patient portal called "MyChart".  Sign up information is provided on this After Visit Summary.  MyChart is used to connect with patients for Virtual Visits (Telemedicine).  Patients are able to view lab/test results, encounter notes, upcoming appointments, etc.  Non-urgent messages can be sent to your provider as well.   To learn more about what you can do with MyChart, go to [email or web address].    Your next appointment:   12 month(s)  Provider:   Chilton Si, MD or Gillian Shields, NP

## 2023-09-02 ENCOUNTER — Encounter (HOSPITAL_COMMUNITY): Payer: Self-pay

## 2023-09-09 ENCOUNTER — Ambulatory Visit (HOSPITAL_COMMUNITY): Payer: Commercial Managed Care - PPO | Attending: Cardiovascular Disease

## 2023-09-09 DIAGNOSIS — E10311 Type 1 diabetes mellitus with unspecified diabetic retinopathy with macular edema: Secondary | ICD-10-CM

## 2023-09-09 DIAGNOSIS — R0609 Other forms of dyspnea: Secondary | ICD-10-CM | POA: Diagnosis not present

## 2023-09-09 LAB — MYOCARDIAL PERFUSION IMAGING
Angina Index: 0
Duke Treadmill Score: 9
Estimated workload: 10.3
Exercise duration (min): 9 min
Exercise duration (sec): 10 s
LV dias vol: 73 mL (ref 46–106)
LV sys vol: 21 mL
MPHR: 171 {beats}/min
Nuc Stress EF: 71 %
Peak HR: 155 {beats}/min
Percent HR: 90 %
Rest HR: 79 {beats}/min
Rest Nuclear Isotope Dose: 10.5 mCi
SDS: 4
SRS: 4
SSS: 9
ST Depression (mm): 0 mm
Stress Nuclear Isotope Dose: 32.4 mCi
TID: 1.02

## 2023-09-09 MED ORDER — TECHNETIUM TC 99M TETROFOSMIN IV KIT
10.5000 | PACK | Freq: Once | INTRAVENOUS | Status: AC | PRN
  Administered 2023-09-09: 10.5 via INTRAVENOUS

## 2023-09-09 MED ORDER — TECHNETIUM TC 99M TETROFOSMIN IV KIT
32.4000 | PACK | Freq: Once | INTRAVENOUS | Status: AC | PRN
  Administered 2023-09-09: 32.4 via INTRAVENOUS

## 2023-09-26 ENCOUNTER — Other Ambulatory Visit (HOSPITAL_COMMUNITY): Payer: Self-pay

## 2023-09-27 ENCOUNTER — Other Ambulatory Visit: Payer: Self-pay

## 2023-09-27 ENCOUNTER — Other Ambulatory Visit (HOSPITAL_COMMUNITY): Payer: Self-pay

## 2023-09-30 ENCOUNTER — Other Ambulatory Visit (HOSPITAL_COMMUNITY): Payer: Self-pay

## 2023-10-06 ENCOUNTER — Other Ambulatory Visit (HOSPITAL_COMMUNITY): Payer: Commercial Managed Care - PPO

## 2023-10-15 DIAGNOSIS — E0590 Thyrotoxicosis, unspecified without thyrotoxic crisis or storm: Secondary | ICD-10-CM | POA: Diagnosis not present

## 2023-10-15 DIAGNOSIS — E1065 Type 1 diabetes mellitus with hyperglycemia: Secondary | ICD-10-CM | POA: Diagnosis not present

## 2023-10-15 DIAGNOSIS — Z794 Long term (current) use of insulin: Secondary | ICD-10-CM | POA: Diagnosis not present

## 2023-10-15 DIAGNOSIS — E0500 Thyrotoxicosis with diffuse goiter without thyrotoxic crisis or storm: Secondary | ICD-10-CM | POA: Diagnosis not present

## 2023-10-15 DIAGNOSIS — E103213 Type 1 diabetes mellitus with mild nonproliferative diabetic retinopathy with macular edema, bilateral: Secondary | ICD-10-CM | POA: Diagnosis not present

## 2023-10-16 ENCOUNTER — Other Ambulatory Visit (HOSPITAL_COMMUNITY): Payer: Self-pay

## 2023-10-16 MED ORDER — OMNIPOD 5 G7 INTRO (GEN 5) KIT
1.0000 | PACK | 1 refills | Status: AC
  Filled 2023-10-16 – 2024-01-25 (×3): qty 1, 30d supply, fill #0

## 2023-10-16 MED ORDER — DEXCOM G6 SENSOR MISC
4 refills | Status: DC
  Filled 2023-10-16: qty 9, 84d supply, fill #0

## 2023-10-16 MED ORDER — OMNIPOD 5 G7 PODS (GEN 5) MISC
1.0000 | 4 refills | Status: AC
  Filled 2023-10-16: qty 30, 10d supply, fill #0
  Filled 2024-01-18: qty 30, fill #0
  Filled 2024-01-25 – 2024-01-26 (×2): qty 30, 90d supply, fill #0

## 2023-10-16 MED ORDER — DEXCOM G7 RECEIVER DEVI
0 refills | Status: AC
  Filled 2023-10-16 – 2024-01-25 (×3): qty 1, 30d supply, fill #0

## 2023-10-18 LAB — HEMOGLOBIN A1C: Hemoglobin A1C: 8.4

## 2023-10-19 ENCOUNTER — Encounter: Payer: Self-pay | Admitting: Pharmacist

## 2023-10-19 ENCOUNTER — Other Ambulatory Visit: Payer: Self-pay

## 2023-10-19 ENCOUNTER — Other Ambulatory Visit (HOSPITAL_COMMUNITY): Payer: Self-pay

## 2023-10-29 ENCOUNTER — Other Ambulatory Visit (HOSPITAL_COMMUNITY): Payer: Self-pay

## 2023-10-29 ENCOUNTER — Telehealth: Payer: Commercial Managed Care - PPO | Admitting: Family Medicine

## 2023-10-29 ENCOUNTER — Encounter: Payer: Self-pay | Admitting: Family Medicine

## 2023-10-29 DIAGNOSIS — F418 Other specified anxiety disorders: Secondary | ICD-10-CM | POA: Diagnosis not present

## 2023-10-29 DIAGNOSIS — E0500 Thyrotoxicosis with diffuse goiter without thyrotoxic crisis or storm: Secondary | ICD-10-CM | POA: Diagnosis not present

## 2023-10-29 MED ORDER — PROPRANOLOL HCL 20 MG PO TABS
20.0000 mg | ORAL_TABLET | Freq: Two times a day (BID) | ORAL | 2 refills | Status: DC | PRN
  Filled 2023-10-29: qty 30, 15d supply, fill #0

## 2023-10-29 NOTE — Progress Notes (Signed)
Phone [phone] Virtual visit via Video note   Subjective:  Chief complaint: Chief Complaint  Patient presents with   Anxiety    Pt would like an RX for Propanolol for test anxiety.   Our team/I connected with Penny Andrade at  4:00 PM EDT by a video enabled telemedicine application (caregility through epic) and verified that I am speaking with the correct person using two identifiers. Our team/I discussed the limitations of evaluation and management by telemedicine and the availability of in person appointments.No physical exam was performed (except for noted visual exam or audio findings with Telehealth visits).   Location patient: [place] Location provider: [facility], office Persons participating in the virtual visit:  patient  Past Medical History-  Patient Active Problem List   Diagnosis Date Noted   DM (diabetes mellitus) type I controlled with eye manifestation (HCC)     Priority: High   GERD (gastroesophageal reflux disease) 10/29/2020    Priority: Medium    Acne 03/08/2020    Priority: Medium    Graves disease 02/07/2019    Priority: Medium    Adhesive capsulitis 12/03/2017    Priority: Low   Syncope and collapse 08/27/2023   Exertional dyspnea 08/27/2023   Atypical chest pain 08/27/2023    Medications- reviewed and updated Current Outpatient Medications  Medication Sig Dispense Refill   Adapalene-Benzoyl Peroxide 0.1-2.5 % gel APPLY EXTERNALLY TO THE AFFECTED AREA AT BEDTIME 45 g 5   atorvastatin (LIPITOR) 10 MG tablet Take 1 tablet (10 mg total) by mouth daily. 90 tablet 4   Continuous Blood Gluc Transmit (DEXCOM G6 TRANSMITTER) MISC Change transmitter every 90 days 1 each 4   Continuous Glucose Receiver (DEXCOM G7 RECEIVER) DEVI use as directed 1 each 0   Continuous Glucose Sensor (DEXCOM G6 SENSOR) MISC Use to check blood sugar as directed. Change sensor every 10 days 9 each 4   Continuous Glucose Sensor (DEXCOM G6 SENSOR) MISC use as directed  changing every 10 days. 9 each 4   Continuous Glucose Transmitter (GUARDIAN TRANSMITTER) MISC Use and change transmitter every 90 days 1 each 4   doxycycline (VIBRA-TABS) 100 MG tablet Take 1 tablet (100 mg total) by mouth 2 (two) times daily. 60 tablet 3   hydrOXYzine (ATARAX) 25 MG tablet Take 1 tablet (25 mg total) by mouth 2 (two) times daily as needed. 30 tablet 2   Insulin Disposable Pump (OMNIPOD 5 G6 PODS, GEN 5,) MISC Use as directed. Change pod every 3 days. 30 each 4   Insulin Disposable Pump (OMNIPOD 5 G7 INTRO, GEN 5,) KIT use as directed changing pod every 3 days 1 kit 1   Insulin Disposable Pump (OMNIPOD 5 G7 PODS, GEN 5,) MISC use as directed changing pod every 3 days 30 each 4   Insulin Lispro-aabc (LYUMJEV) 100 UNIT/ML SOLN Inject 70 Units into the skin daily. 30 mL 11   Insulin Lispro-aabc (LYUMJEV) 100 UNIT/ML SOLN Inject up to 70 units/day via insulin pump 70 mL 4   ipratropium (ATROVENT) 0.03 % nasal spray Place 2 sprays into both nostrils every 12 (twelve) hours. 30 mL 0   methimazole (TAPAZOLE) 10 MG tablet Take 5 mg by mouth daily.      pantoprazole (PROTONIX) 40 MG tablet Take 1 tablet (40 mg total) by mouth daily. 30 tablet 3   phentermine (ADIPEX-P) 37.5 MG tablet Take 37.5 mg by mouth daily.     No current facility-administered medications for this visit.     Objective:  No self reported vitals Gen: NAD, resting comfortably Lungs: nonlabored, normal respiratory rate  Skin: appears dry, no obvious rash     Assessment and Plan    # Graves' disease-follows with Dr. Sharl Ma S: Medication:  methimazole 5 mg daily   A/P: Reports had recent visit and reasonably well-controlled-continue current medication  # Situational anxiety S: Medication: In January 2024 we opted to trial hydroxyzine as needed for anxiety.  Also considered buspirone.  She did not want to take a daily medicine at that time. -multiple stressors on her plate when initially discussed and overall  anxiety had been better  More recently main issues are test taking anxiety concern but this dates back to past a well: -finished her certification with school and ready to take boards - test anxiety is really bothersome- had propranolol for NP boards in past but prescription is 20-71 years old. 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 -She had previously been on atenolol for Graves' disease but reports she is not taking that at present   Recommended follow up:  as needed for acute concern- otherwise keep may visit Future Appointments  Date Time Provider Department Center  11/17/2023  2:00 PM [facility] 1 [facility] [place] [facility]  12/30/2023  3:45 PM Mare Loan, RD [facility] None  04/28/2024  9:00 AM Shelva Majestic, MD [facility]   Lab/Order associations:   ICD-10-CM   1. Situational anxiety  F41.8     2. Graves disease  E05.00       Meds ordered this encounter  Medications   propranolol (INDERAL) 20 MG tablet    Sig: Take 1 tablet (20 mg total) by mouth 2 (two) times daily as needed (for anxiety).    Dispense:  30 tablet    Refill:  2    Return precautions advised.  Tana Conch, MD

## 2023-10-30 ENCOUNTER — Other Ambulatory Visit (HOSPITAL_COMMUNITY): Payer: Self-pay

## 2023-10-30 ENCOUNTER — Encounter (HOSPITAL_COMMUNITY): Payer: Self-pay

## 2023-11-01 ENCOUNTER — Other Ambulatory Visit (HOSPITAL_COMMUNITY): Payer: Self-pay

## 2023-11-04 ENCOUNTER — Telehealth: Payer: Commercial Managed Care - PPO | Admitting: Family Medicine

## 2023-11-17 ENCOUNTER — Ambulatory Visit (HOSPITAL_COMMUNITY): Payer: Commercial Managed Care - PPO

## 2023-11-24 ENCOUNTER — Other Ambulatory Visit (HOSPITAL_COMMUNITY): Payer: Self-pay

## 2023-12-01 ENCOUNTER — Other Ambulatory Visit: Payer: Self-pay

## 2023-12-01 ENCOUNTER — Other Ambulatory Visit (HOSPITAL_COMMUNITY): Payer: Self-pay

## 2023-12-01 MED ORDER — DEXCOM G6 TRANSMITTER MISC
1.0000 | 0 refills | Status: DC
  Filled 2023-12-01: qty 1, 90d supply, fill #0

## 2023-12-30 ENCOUNTER — Ambulatory Visit: Payer: Commercial Managed Care - PPO | Admitting: Nutrition

## 2024-01-18 ENCOUNTER — Other Ambulatory Visit: Payer: Self-pay

## 2024-01-18 ENCOUNTER — Encounter (HOSPITAL_COMMUNITY): Payer: Self-pay

## 2024-01-18 ENCOUNTER — Other Ambulatory Visit (HOSPITAL_COMMUNITY): Payer: Self-pay

## 2024-01-19 ENCOUNTER — Other Ambulatory Visit: Payer: Self-pay

## 2024-01-19 ENCOUNTER — Other Ambulatory Visit (HOSPITAL_BASED_OUTPATIENT_CLINIC_OR_DEPARTMENT_OTHER): Payer: Self-pay

## 2024-01-20 ENCOUNTER — Other Ambulatory Visit: Payer: Self-pay

## 2024-01-24 ENCOUNTER — Other Ambulatory Visit (HOSPITAL_COMMUNITY): Payer: Self-pay

## 2024-01-26 ENCOUNTER — Other Ambulatory Visit (HOSPITAL_COMMUNITY): Payer: Self-pay

## 2024-01-26 ENCOUNTER — Ambulatory Visit (HOSPITAL_COMMUNITY)
Admission: RE | Admit: 2024-01-26 | Discharge: 2024-01-26 | Disposition: A | Discharge status: Home / Self Care | Payer: Self-pay | Source: Ambulatory Visit | Attending: Cardiovascular Disease | Admitting: Cardiovascular Disease

## 2024-01-26 ENCOUNTER — Other Ambulatory Visit: Payer: Self-pay

## 2024-01-26 DIAGNOSIS — R0609 Other forms of dyspnea: Secondary | ICD-10-CM

## 2024-01-26 DIAGNOSIS — E10311 Type 1 diabetes mellitus with unspecified diabetic retinopathy with macular edema: Secondary | ICD-10-CM

## 2024-01-27 ENCOUNTER — Other Ambulatory Visit: Payer: Self-pay

## 2024-01-31 ENCOUNTER — Other Ambulatory Visit (HOSPITAL_COMMUNITY): Payer: Self-pay

## 2024-01-31 ENCOUNTER — Other Ambulatory Visit: Payer: Self-pay

## 2024-02-02 ENCOUNTER — Other Ambulatory Visit (HOSPITAL_COMMUNITY): Payer: Self-pay

## 2024-02-02 ENCOUNTER — Other Ambulatory Visit: Payer: Self-pay

## 2024-02-02 ENCOUNTER — Ambulatory Visit (HOSPITAL_COMMUNITY)
Admission: RE | Admit: 2024-02-02 | Discharge: 2024-02-02 | Disposition: A | Discharge status: Home / Self Care | Payer: Self-pay | Source: Ambulatory Visit | Attending: Cardiovascular Disease | Admitting: Cardiovascular Disease

## 2024-02-02 DIAGNOSIS — E10311 Type 1 diabetes mellitus with unspecified diabetic retinopathy with macular edema: Secondary | ICD-10-CM | POA: Insufficient documentation

## 2024-02-02 DIAGNOSIS — R0609 Other forms of dyspnea: Secondary | ICD-10-CM | POA: Insufficient documentation

## 2024-02-03 ENCOUNTER — Other Ambulatory Visit: Payer: Self-pay

## 2024-02-03 ENCOUNTER — Other Ambulatory Visit (HOSPITAL_COMMUNITY): Payer: Self-pay

## 2024-02-03 MED ORDER — DEXCOM G7 SENSOR MISC
1.0000 | 4 refills | Status: AC
  Filled 2024-02-03 – 2024-02-19 (×3): qty 9, 90d supply, fill #0
  Filled 2024-05-17: qty 9, 90d supply, fill #1
  Filled 2024-09-12: qty 9, 90d supply, fill #2
  Filled 2024-12-28: qty 9, 90d supply, fill #3

## 2024-02-04 ENCOUNTER — Other Ambulatory Visit (HOSPITAL_COMMUNITY): Payer: Self-pay

## 2024-02-08 ENCOUNTER — Other Ambulatory Visit: Payer: Self-pay

## 2024-02-08 ENCOUNTER — Other Ambulatory Visit (HOSPITAL_BASED_OUTPATIENT_CLINIC_OR_DEPARTMENT_OTHER): Payer: Self-pay

## 2024-02-08 ENCOUNTER — Other Ambulatory Visit (HOSPITAL_COMMUNITY): Payer: Self-pay

## 2024-02-10 ENCOUNTER — Other Ambulatory Visit (HOSPITAL_COMMUNITY): Payer: Self-pay

## 2024-02-10 DIAGNOSIS — Z794 Long term (current) use of insulin: Secondary | ICD-10-CM | POA: Diagnosis not present

## 2024-02-10 DIAGNOSIS — E0500 Thyrotoxicosis with diffuse goiter without thyrotoxic crisis or storm: Secondary | ICD-10-CM | POA: Diagnosis not present

## 2024-02-10 DIAGNOSIS — E1065 Type 1 diabetes mellitus with hyperglycemia: Secondary | ICD-10-CM | POA: Diagnosis not present

## 2024-02-10 DIAGNOSIS — E0590 Thyrotoxicosis, unspecified without thyrotoxic crisis or storm: Secondary | ICD-10-CM | POA: Diagnosis not present

## 2024-02-10 DIAGNOSIS — E669 Obesity, unspecified: Secondary | ICD-10-CM | POA: Diagnosis not present

## 2024-02-10 DIAGNOSIS — E103213 Type 1 diabetes mellitus with mild nonproliferative diabetic retinopathy with macular edema, bilateral: Secondary | ICD-10-CM | POA: Diagnosis not present

## 2024-02-10 LAB — HEMOGLOBIN A1C: Hemoglobin A1C: 8.5

## 2024-02-10 MED ORDER — ZEPBOUND 2.5 MG/0.5ML ~~LOC~~ SOAJ
2.5000 mg | SUBCUTANEOUS | 0 refills | Status: AC
  Filled 2024-02-10 (×2): qty 2, 28d supply, fill #0

## 2024-02-12 ENCOUNTER — Other Ambulatory Visit (HOSPITAL_COMMUNITY): Payer: Self-pay

## 2024-02-12 MED ORDER — WEGOVY 0.25 MG/0.5ML ~~LOC~~ SOAJ
0.2500 mg | SUBCUTANEOUS | 0 refills | Status: DC
  Filled 2024-02-12: qty 2, 28d supply, fill #0

## 2024-02-13 ENCOUNTER — Encounter (HOSPITAL_BASED_OUTPATIENT_CLINIC_OR_DEPARTMENT_OTHER): Payer: Self-pay

## 2024-02-17 ENCOUNTER — Other Ambulatory Visit (HOSPITAL_COMMUNITY): Payer: Self-pay

## 2024-02-19 ENCOUNTER — Other Ambulatory Visit (HOSPITAL_COMMUNITY): Payer: Self-pay

## 2024-02-21 ENCOUNTER — Other Ambulatory Visit: Payer: Self-pay

## 2024-03-13 ENCOUNTER — Other Ambulatory Visit (HOSPITAL_COMMUNITY): Payer: Self-pay

## 2024-03-13 MED ORDER — "TUBERCULIN SYRINGE 25G X 5/8"" 1 ML MISC"
1.0000 | 2 refills | Status: AC
  Filled 2024-03-13: qty 4, 28d supply, fill #0

## 2024-03-31 ENCOUNTER — Other Ambulatory Visit (HOSPITAL_COMMUNITY): Payer: Self-pay

## 2024-04-20 ENCOUNTER — Other Ambulatory Visit: Payer: Self-pay

## 2024-04-20 ENCOUNTER — Other Ambulatory Visit (HOSPITAL_COMMUNITY): Payer: Self-pay

## 2024-04-28 ENCOUNTER — Ambulatory Visit (INDEPENDENT_AMBULATORY_CARE_PROVIDER_SITE_OTHER): Payer: Commercial Managed Care - PPO | Admitting: Family Medicine

## 2024-04-28 ENCOUNTER — Encounter: Payer: Self-pay | Admitting: Family Medicine

## 2024-04-28 VITALS — BP 120/70 | HR 74 | Temp 97.2°F | Ht 71.0 in | Wt 208.6 lb

## 2024-04-28 DIAGNOSIS — E0500 Thyrotoxicosis with diffuse goiter without thyrotoxic crisis or storm: Secondary | ICD-10-CM | POA: Diagnosis not present

## 2024-04-28 DIAGNOSIS — Z0000 Encounter for general adult medical examination without abnormal findings: Secondary | ICD-10-CM | POA: Diagnosis not present

## 2024-04-28 DIAGNOSIS — Z79899 Other long term (current) drug therapy: Secondary | ICD-10-CM

## 2024-04-28 DIAGNOSIS — E10311 Type 1 diabetes mellitus with unspecified diabetic retinopathy with macular edema: Secondary | ICD-10-CM | POA: Diagnosis not present

## 2024-04-28 DIAGNOSIS — Z1231 Encounter for screening mammogram for malignant neoplasm of breast: Secondary | ICD-10-CM | POA: Diagnosis not present

## 2024-04-28 LAB — MICROALBUMIN / CREATININE URINE RATIO
Creatinine,U: 156.8 mg/dL
Microalb Creat Ratio: UNDETERMINED mg/g (ref 0.0–30.0)
Microalb, Ur: <0.7 mg/dL

## 2024-04-28 LAB — COMPREHENSIVE METABOLIC PANEL WITH GFR
ALT: 14 U/L (ref 0–35)
AST: 15 U/L (ref 0–37)
Albumin: 4.4 g/dL (ref 3.5–5.2)
Alkaline Phosphatase: 67 U/L (ref 39–117)
BUN: 10 mg/dL (ref 6–23)
CO2: 30 meq/L (ref 19–32)
Calcium: 9.5 mg/dL (ref 8.4–10.5)
Chloride: 102 meq/L (ref 96–112)
Creatinine, Ser: 0.76 mg/dL (ref 0.40–1.20)
GFR: 91.59 mL/min (ref >60.00)
Glucose, Bld: 157 mg/dL — ABNORMAL HIGH (ref 70–99)
Potassium: 4.4 meq/L (ref 3.5–5.1)
Sodium: 140 meq/L (ref 135–145)
Total Bilirubin: 0.6 mg/dL (ref 0.2–1.2)
Total Protein: 7.5 g/dL (ref 6.0–8.3)

## 2024-04-28 LAB — LIPID PANEL
Cholesterol: 192 mg/dL (ref 0–200)
HDL: 77.2 mg/dL (ref >39.00)
LDL Cholesterol: 103 mg/dL — ABNORMAL HIGH (ref 0–99)
NonHDL: 115.16
Total CHOL/HDL Ratio: 2
Triglycerides: 59 mg/dL (ref 0.0–149.0)
VLDL: 11.8 mg/dL (ref 0.0–40.0)

## 2024-04-28 NOTE — Patient Instructions (Addendum)
 Let us  know if you get shingrix, Prevnar 20, or COVID shot  Have GYN send us  a copy of pap   Exercise prescription- 150 minutes a week of something you enjoy that you can sustain  Please stop by lab before you go If you have mychart- we will send your results within 3 business days of us  receiving them.  If you do not have mychart- we will call you about results within 5 business days of us  receiving them.  *please also note that you will see labs on mychart as soon as they post. I will later go in and write notes on them- will say "notes from Dr. Arlene Ben"   [facility] Schedule an appointment by calling [phone].  Recommended follow up: Return in about 1 year (around 04/28/2025) for physical or sooner if needed.Schedule b4 you leave.

## 2024-04-28 NOTE — Progress Notes (Signed)
 Phone [phone]   Subjective:  Patient presents today for their annual physical. Chief complaint-noted.   See problem oriented charting- ROS- full  review of systems was completed and negative Per full ROS sheet completed by patient  The following were reviewed and entered/updated in epic: Past Medical History:  Diagnosis Date   Alopecia    CCCA   Atypical chest pain 08/27/2023   Diabetes mellitus without complication (HCC)    diagnosed at 48 years old 59. Dr. Gordy Lauber   Exertional dyspnea 08/27/2023   Syncope and collapse 08/27/2023   UTI (urinary tract infection)    Patient Active Problem List   Diagnosis Date Noted   DM (diabetes mellitus) type I controlled with eye manifestation (HCC)     Priority: High   GERD (gastroesophageal reflux disease) 10/29/2020    Priority: Medium    Acne 03/08/2020    Priority: Medium    Graves disease 02/07/2019    Priority: Medium    Adhesive capsulitis 12/03/2017    Priority: Low   Syncope and collapse 08/27/2023   Exertional dyspnea 08/27/2023   Atypical chest pain 08/27/2023   Past Surgical History:  Procedure Laterality Date   none      Family History  Problem Relation Age of Onset   Hypertension Mother    Arthritis Mother    Hyperlipidemia Father    Hypertension Father    Atrial fibrillation Father        cardioversion and ablation.    Stroke Father    Multiple myeloma Brother        passed at 1 from this   Breast cancer Paternal Grandmother    Pancreatic cancer Paternal Grandfather    Heart attack Paternal Uncle    Stroke Paternal Uncle     Medications- reviewed and updated Current Outpatient Medications  Medication Sig Dispense Refill   Adapalene -Benzoyl Peroxide  0.1-2.5 % gel APPLY EXTERNALLY TO THE AFFECTED AREA AT BEDTIME 45 g 5   atorvastatin  (LIPITOR) 10 MG tablet Take 1 tablet (10 mg total) by mouth daily. 90 tablet 4   Continuous Glucose Receiver (DEXCOM G7 RECEIVER) DEVI use as directed 1 each  0   Continuous Glucose Sensor (DEXCOM G7 SENSOR) MISC change sensor every 10 days 9 each 4   doxycycline  (VIBRA -TABS) 100 MG tablet Take 1 tablet (100 mg total) by mouth 2 (two) times daily. 60 tablet 3   hydrOXYzine  (ATARAX ) 25 MG tablet Take 1 tablet (25 mg total) by mouth 2 (two) times daily as needed. 30 tablet 2   Insulin  Disposable Pump (OMNIPOD 5 DEXG7G6 PODS GEN 5) MISC Use as directed. Change pod every 3 days. 30 each 4   Insulin  Disposable Pump (OMNIPOD 5 G7 INTRO, GEN 5,) KIT use as directed changing pod every 3 days 1 kit 1   Insulin  Disposable Pump (OMNIPOD 5 G7 PODS, GEN 5,) MISC use as directed changing pod every 3 days 30 each 4   Insulin  Lispro-aabc (LYUMJEV ) 100 UNIT/ML SOLN Inject 70 Units into the skin daily. 30 mL 11   Insulin  Lispro-aabc (LYUMJEV ) 100 UNIT/ML SOLN Inject up to 70 units/day via insulin  pump 70 mL 4   ipratropium (ATROVENT ) 0.03 % nasal spray Place 2 sprays into both nostrils every 12 (twelve) hours. 30 mL 0   methimazole  (TAPAZOLE ) 10 MG tablet Take 5 mg by mouth daily.      pantoprazole  (PROTONIX ) 40 MG tablet Take 1 tablet (40 mg total) by mouth daily. 30 tablet 3   phentermine (ADIPEX-P)  37.5 MG tablet Take 37.5 mg by mouth daily.     propranolol  (INDERAL ) 20 MG tablet Take 1 tablet (20 mg total) by mouth 2 (two) times daily as needed (for anxiety). 30 tablet 2   tirzepatide  (ZEPBOUND ) 2.5 MG/0.5ML Pen Inject 2.5 mg into the skin once a week. 2 mL 0   TUBERCULIN SYR 1CC/25GX5/8" 25G X 5/8" 1 ML MISC Inject 1 each into the skin once a week. 4 each 2   No current facility-administered medications for this visit.    Allergies-reviewed and updated Allergies  Allergen Reactions   Meloxicam Other (See Comments)    Other Reaction: itchy    Social History   Social History Narrative   Married Sept 2018. Lives with husband. Mom keeps her yorkiepoo. Son is 64 years old in 2018. Patient's husband has disabled son born 26 who is nonverbal from seizure  activity- requires full time care.       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    Objective  Objective:  BP 120/70   Pulse 74   Temp (!) 97.2 F (36.2 C)   Ht 5\' 11"  (1.803 m)   Wt 208 lb 9.6 oz (94.6 kg)   SpO2 98%   BMI 29.09 kg/m  Gen: NAD, resting comfortably HEENT: Mucous membranes are moist. Oropharynx normal Neck: no worsening of thyromegaly, no cervical lymphadenopathy CV: RRR no murmurs rubs or gallops Lungs: CTAB no crackles, wheeze, rhonchi Abdomen: soft/nontender/nondistended/normal bowel sounds. No rebound or guarding.  Ext: no edema Skin: warm, dry Neuro: grossly normal, moves all extremities, PERRLA   Assessment and Plan   48 y.o. female presenting for annual physical.  Health Maintenance counseling: 1. Anticipatory guidance: Patient counseled regarding regular dental exams -q6 months, eye exams-sees [facility] yearly but needs to schedule,  avoiding smoking and second hand smoke, limiting alcohol to 1 beverage per day- very rare social such as trips , no illicit drugs.   2. Risk factor reduction:  Advised patient of need for regular exercise and diet rich and fruits and vegetables to reduce risk of heart attack and stroke.  Exercise- has a home gym set up that has to put together. Goal 150 minutes a week- they have a treadmill but covered up  Diet/weight management-weight up 15 pounds in the last year- reports with menopause and having a lot of cravings-but mainly leaning on Zepbound  recent start to help but costly.  Wt Readings from Last 3 Encounters:  04/28/24 208 lb 9.6 oz (94.6 kg)  08/27/23 194 lb (88  kg)  05/07/23 190 lb (86.2 kg)  3. Immunizations/screenings/ancillary studies- shingrix opts out for now , Prevnar 20 - opts out for now- may call back. Reminder for diabetes eye exam discussed , opts out COVID for now - may get later Immunization History  Administered Date(s) Administered   Influenza Split 11/06/2016, 09/27/2018   Influenza,inj,Quad PF,6+ Mos 10/13/2019, 10/04/2020, 10/01/2022, 10/09/2023   Influenza-Unspecified 09/27/2017, 10/07/2018   Moderna Sars-Covid-2 Vaccination 11/01/2020   PFIZER(Purple Top)SARS-COV-2 Vaccination 12/30/2019, 01/20/2020   PPD Test 01/05/2018, 04/12/2019, 04/29/2019, 05/22/2020  Pneumococcal Polysaccharide-23 09/09/2016   Tdap 12/09/2010, 02/07/2019   4. Cervical cancer screening- prior Pap smear 02/07/2019 here but is scheduled with physician for women's with Dr. Cipriano Creeks soon-had seen them in 2023 due to bleeding ultimately related to polypoid cyst.  5. Breast cancer screening-  breast exam with GYN and mammogram - done last year- ordered repeat 6. Colon cancer screening - Cologuard 06/01/2022 with 3-year repeat 7. Skin cancer screening- lower risk due to melanin content-has seen dermatology in the past for alopecia but not currently seeing, advised regular sunscreen use. Denies worrisome, changing, or new skin lesions.  8. Birth control/STD check-no birth control-no concern for STDs as monogamous.  History of fertility concerns 9. Osteoporosis screening at 63-considered postmenopausal or at least age 51 10. Smoking associated screening -never smoker  Status of chronic or acute concerns    # Social update-working on [employer] - plans to finish back in august -did her boards -now doing some work with psychiatry -took 50th bday trip to [place]   # Diabetes type I-follows with Dr. Kathyanne Parkers S: Medication:Insulin  pump through endocrinology- -Also on Zepbound  2.5 mg- in march  CBGs- monitors with Dexcom 7  Lab Results  Component Value Date   HGBA1C 8.5  02/10/2024   HGBA1C 8.4 10/18/2023   HGBA1C 8.7 03/16/2023  A/P:  hopefully improving a1c- has upcoming follow up with Dr. Kathyanne Parkers. Exercise may help  # Graves' disease-follows with Dr. Kathyanne Parkers  S: Medication: methimazole  5 mg daily    #hyperlipidemia S: Medication:Atorvastatin  10 mg daily -Normal stress test with Dr. Theodis Fiscal 09/09/2023 -CT calcium  scoring of 0 on February 02, 2024  Lab Results  Component Value Date   CHOL 176 04/30/2023   HDL 72.30 04/30/2023   LDLCALC 94 04/30/2023   TRIG 50.0 04/30/2023   CHOLHDL 2 04/30/2023  A/P: lipids reasonably controlled last year but with reassuring CT calcium  would not push dose to get LDL under 70   # Situational anxiety- only in 2024- not needing lately propranolol . Very sparing hydrozyzine  # GERD S:Medication: Pantoprazole  40 mg as needed with triggers- last time over a month A/P: stable- continue current medicines      # Acne-adapalene -benzyl peroxide at bedtime  -doxycycline  once a day if flares  Recommended follow up: Return in about 1 year (around 04/28/2025) for physical or sooner if needed.Schedule b4 you leave.  Lab/Order associations: fasting   ICD-10-CM   1. Preventative health care  Z00.00     2. 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 ratio    Comprehensive metabolic panel with GFR    CBC with Differential/Platelet    Lipid panel    3. High risk medication use  Z79.899     4. Graves disease  E05.00     5. Encounter for screening mammogram for malignant neoplasm of breast  Z12.31 MM 3D SCREENING MAMMOGRAM BILATERAL BREAST      No orders of the defined types were placed in this encounter.   Return precautions advised.  Clarisa Crooked, MD

## 2024-04-29 LAB — CBC WITH DIFFERENTIAL/PLATELET
Basophils Absolute: 0 10*3/uL (ref 0.0–0.1)
Basophils Relative: 1.1 % (ref 0.0–3.0)
Eosinophils Absolute: 0.1 10*3/uL (ref 0.0–0.7)
Eosinophils Relative: 2.7 % (ref 0.0–5.0)
HCT: 39.8 % (ref 36.0–46.0)
Hemoglobin: 13 g/dL (ref 12.0–15.0)
Lymphocytes Relative: 32.7 % (ref 12.0–46.0)
Lymphs Abs: 1.5 10*3/uL (ref 0.7–4.0)
MCHC: 32.7 g/dL (ref 30.0–36.0)
MCV: 80.6 fl (ref 78.0–100.0)
Monocytes Absolute: 0.4 10*3/uL (ref 0.1–1.0)
Monocytes Relative: 9.5 % (ref 3.0–12.0)
Neutro Abs: 2.4 10*3/uL (ref 1.4–7.7)
Neutrophils Relative %: 54 % (ref 43.0–77.0)
Platelets: 243 10*3/uL (ref 150.0–400.0)
RBC: 4.94 Mil/uL (ref 3.87–5.11)
RDW: 13 % (ref 11.5–15.5)
WBC: 4.5 10*3/uL (ref 4.0–10.5)

## 2024-05-01 ENCOUNTER — Encounter: Payer: Self-pay | Admitting: Family Medicine

## 2024-05-17 ENCOUNTER — Other Ambulatory Visit: Payer: Self-pay

## 2024-05-17 ENCOUNTER — Other Ambulatory Visit (HOSPITAL_COMMUNITY): Payer: Self-pay

## 2024-05-18 ENCOUNTER — Other Ambulatory Visit (HOSPITAL_COMMUNITY): Payer: Self-pay

## 2024-05-18 ENCOUNTER — Other Ambulatory Visit: Payer: Self-pay

## 2024-05-18 DIAGNOSIS — N951 Menopausal and female climacteric states: Secondary | ICD-10-CM | POA: Diagnosis not present

## 2024-05-18 DIAGNOSIS — D219 Benign neoplasm of connective and other soft tissue, unspecified: Secondary | ICD-10-CM | POA: Diagnosis not present

## 2024-05-18 MED ORDER — PROGESTERONE 200 MG PO CAPS
200.0000 mg | ORAL_CAPSULE | Freq: Every day | ORAL | 3 refills | Status: AC
  Filled 2024-05-18: qty 90, 90d supply, fill #0
  Filled 2024-07-28 – 2024-07-31 (×3): qty 90, 90d supply, fill #1

## 2024-05-18 MED ORDER — ESTRADIOL 0.05 MG/24HR TD PTTW
1.0000 | MEDICATED_PATCH | TRANSDERMAL | 3 refills | Status: AC
Start: 2024-05-18 — End: ?
  Filled 2024-05-18: qty 24, 84d supply, fill #0
  Filled 2024-07-28: qty 24, 84d supply, fill #1

## 2024-05-23 ENCOUNTER — Other Ambulatory Visit: Payer: Self-pay

## 2024-05-23 ENCOUNTER — Other Ambulatory Visit (HOSPITAL_COMMUNITY): Payer: Self-pay

## 2024-06-09 DIAGNOSIS — E103213 Type 1 diabetes mellitus with mild nonproliferative diabetic retinopathy with macular edema, bilateral: Secondary | ICD-10-CM | POA: Diagnosis not present

## 2024-06-09 DIAGNOSIS — E1065 Type 1 diabetes mellitus with hyperglycemia: Secondary | ICD-10-CM | POA: Diagnosis not present

## 2024-06-09 DIAGNOSIS — E0590 Thyrotoxicosis, unspecified without thyrotoxic crisis or storm: Secondary | ICD-10-CM | POA: Diagnosis not present

## 2024-06-09 DIAGNOSIS — Z794 Long term (current) use of insulin: Secondary | ICD-10-CM | POA: Diagnosis not present

## 2024-06-09 DIAGNOSIS — E0500 Thyrotoxicosis with diffuse goiter without thyrotoxic crisis or storm: Secondary | ICD-10-CM | POA: Diagnosis not present

## 2024-06-09 DIAGNOSIS — E669 Obesity, unspecified: Secondary | ICD-10-CM | POA: Diagnosis not present

## 2024-06-09 LAB — CBC AND DIFFERENTIAL
HCT: 38 (ref 36–46)
Hemoglobin: 12.4 (ref 12.0–16.0)
Platelets: 230 10*3/uL (ref 150–400)
WBC: 4.1

## 2024-06-09 LAB — HEMOGLOBIN A1C: Hemoglobin A1C: 8.1

## 2024-06-09 LAB — CBC: RBC: 4.72 (ref 3.87–5.11)

## 2024-06-09 LAB — TSH: TSH: 0.55 (ref 0.41–5.90)

## 2024-06-28 DIAGNOSIS — D259 Leiomyoma of uterus, unspecified: Secondary | ICD-10-CM | POA: Diagnosis not present

## 2024-06-28 DIAGNOSIS — D252 Subserosal leiomyoma of uterus: Secondary | ICD-10-CM | POA: Diagnosis not present

## 2024-07-14 ENCOUNTER — Other Ambulatory Visit: Payer: Self-pay

## 2024-07-14 DIAGNOSIS — Z111 Encounter for screening for respiratory tuberculosis: Secondary | ICD-10-CM

## 2024-07-17 DIAGNOSIS — Z1151 Encounter for screening for human papillomavirus (HPV): Secondary | ICD-10-CM | POA: Diagnosis not present

## 2024-07-17 DIAGNOSIS — Z01419 Encounter for gynecological examination (general) (routine) without abnormal findings: Secondary | ICD-10-CM | POA: Diagnosis not present

## 2024-07-17 DIAGNOSIS — Z6828 Body mass index (BMI) 28.0-28.9, adult: Secondary | ICD-10-CM | POA: Diagnosis not present

## 2024-07-17 DIAGNOSIS — Z124 Encounter for screening for malignant neoplasm of cervix: Secondary | ICD-10-CM | POA: Diagnosis not present

## 2024-07-18 ENCOUNTER — Other Ambulatory Visit: Payer: Self-pay | Admitting: Family Medicine

## 2024-07-18 DIAGNOSIS — Z111 Encounter for screening for respiratory tuberculosis: Secondary | ICD-10-CM | POA: Diagnosis not present

## 2024-07-22 LAB — QUANTIFERON-TB GOLD PLUS
QuantiFERON Mitogen Value: >10 [IU]/mL
QuantiFERON Nil Value: 0.03 [IU]/mL
QuantiFERON TB1 Ag Value: 0.06 [IU]/mL
QuantiFERON TB2 Ag Value: 0.02 [IU]/mL
QuantiFERON-TB Gold Plus: NEGATIVE

## 2024-07-26 ENCOUNTER — Ambulatory Visit: Payer: Self-pay | Admitting: Family Medicine

## 2024-07-28 ENCOUNTER — Other Ambulatory Visit (HOSPITAL_COMMUNITY): Payer: Self-pay

## 2024-07-28 ENCOUNTER — Other Ambulatory Visit: Payer: Self-pay

## 2024-07-28 ENCOUNTER — Other Ambulatory Visit: Payer: Self-pay | Admitting: Family Medicine

## 2024-07-28 MED ORDER — HYDROXYZINE HCL 25 MG PO TABS
25.0000 mg | ORAL_TABLET | Freq: Two times a day (BID) | ORAL | 2 refills | Status: AC | PRN
  Filled 2024-07-28: qty 30, 15d supply, fill #0
  Filled 2024-12-10: qty 30, 15d supply, fill #1
  Filled 2025-01-26: qty 30, 15d supply, fill #2

## 2024-07-29 ENCOUNTER — Other Ambulatory Visit (HOSPITAL_COMMUNITY): Payer: Self-pay

## 2024-07-31 ENCOUNTER — Other Ambulatory Visit: Payer: Self-pay

## 2024-07-31 ENCOUNTER — Other Ambulatory Visit (HOSPITAL_COMMUNITY): Payer: Self-pay

## 2024-07-31 MED ORDER — OMNIPOD 5 DEXG7G6 PODS GEN 5 MISC
4 refills | Status: AC
  Filled 2024-07-31: qty 30, 90d supply, fill #0
  Filled 2024-10-15: qty 30, 90d supply, fill #1
  Filled 2025-01-26: qty 30, 90d supply, fill #2

## 2024-08-13 ENCOUNTER — Other Ambulatory Visit: Payer: Self-pay

## 2024-08-13 ENCOUNTER — Emergency Department (HOSPITAL_COMMUNITY)

## 2024-08-13 ENCOUNTER — Encounter (HOSPITAL_COMMUNITY): Payer: Self-pay | Admitting: Emergency Medicine

## 2024-08-13 ENCOUNTER — Observation Stay (HOSPITAL_COMMUNITY)
Admission: EM | Admit: 2024-08-13 | Discharge: 2024-08-14 | Disposition: A | Discharge status: Home / Self Care | Attending: Internal Medicine | Admitting: Internal Medicine

## 2024-08-13 DIAGNOSIS — E66811 Obesity, class 1: Secondary | ICD-10-CM | POA: Diagnosis not present

## 2024-08-13 DIAGNOSIS — E1065 Type 1 diabetes mellitus with hyperglycemia: Secondary | ICD-10-CM | POA: Diagnosis not present

## 2024-08-13 DIAGNOSIS — K219 Gastro-esophageal reflux disease without esophagitis: Secondary | ICD-10-CM | POA: Insufficient documentation

## 2024-08-13 DIAGNOSIS — E870 Hyperosmolality and hypernatremia: Secondary | ICD-10-CM | POA: Insufficient documentation

## 2024-08-13 DIAGNOSIS — R000 Tachycardia, unspecified: Secondary | ICD-10-CM | POA: Diagnosis not present

## 2024-08-13 DIAGNOSIS — E1010 Type 1 diabetes mellitus with ketoacidosis without coma: Secondary | ICD-10-CM | POA: Diagnosis not present

## 2024-08-13 DIAGNOSIS — E785 Hyperlipidemia, unspecified: Secondary | ICD-10-CM | POA: Insufficient documentation

## 2024-08-13 DIAGNOSIS — F1090 Alcohol use, unspecified, uncomplicated: Secondary | ICD-10-CM | POA: Diagnosis not present

## 2024-08-13 DIAGNOSIS — Z9641 Presence of insulin pump (external) (internal): Secondary | ICD-10-CM | POA: Diagnosis not present

## 2024-08-13 DIAGNOSIS — Z6825 Body mass index (BMI) 25.0-25.9, adult: Secondary | ICD-10-CM | POA: Insufficient documentation

## 2024-08-13 DIAGNOSIS — R739 Hyperglycemia, unspecified: Secondary | ICD-10-CM | POA: Diagnosis present

## 2024-08-13 DIAGNOSIS — Z794 Long term (current) use of insulin: Secondary | ICD-10-CM | POA: Insufficient documentation

## 2024-08-13 DIAGNOSIS — E0590 Thyrotoxicosis, unspecified without thyrotoxic crisis or storm: Secondary | ICD-10-CM | POA: Insufficient documentation

## 2024-08-13 DIAGNOSIS — R112 Nausea with vomiting, unspecified: Secondary | ICD-10-CM | POA: Diagnosis not present

## 2024-08-13 LAB — BASIC METABOLIC PANEL WITH GFR
Anion gap: 20 — ABNORMAL HIGH (ref 5–15)
Anion gap: 11 (ref 5–15)
Anion gap: 7 (ref 5–15)
Anion gap: 9 (ref 5–15)
BUN: 15 mg/dL (ref 6–20)
BUN: 14 mg/dL (ref 6–20)
BUN: 12 mg/dL (ref 6–20)
BUN: 10 mg/dL (ref 6–20)
CO2: 15 mmol/L — ABNORMAL LOW (ref 22–32)
CO2: 18 mmol/L — ABNORMAL LOW (ref 22–32)
CO2: 19 mmol/L — ABNORMAL LOW (ref 22–32)
CO2: 19 mmol/L — ABNORMAL LOW (ref 22–32)
Calcium: 9.3 mg/dL (ref 8.9–10.3)
Calcium: 8.7 mg/dL — ABNORMAL LOW (ref 8.9–10.3)
Calcium: 8.5 mg/dL — ABNORMAL LOW (ref 8.9–10.3)
Calcium: 8.6 mg/dL — ABNORMAL LOW (ref 8.9–10.3)
Chloride: 97 mmol/L — ABNORMAL LOW (ref 98–111)
Chloride: 103 mmol/L (ref 98–111)
Chloride: 105 mmol/L (ref 98–111)
Chloride: 106 mmol/L (ref 98–111)
Creatinine, Ser: 1.02 mg/dL — ABNORMAL HIGH (ref 0.44–1.00)
Creatinine, Ser: 0.85 mg/dL (ref 0.44–1.00)
Creatinine, Ser: 0.74 mg/dL (ref 0.44–1.00)
Creatinine, Ser: 0.73 mg/dL (ref 0.44–1.00)
GFR, Estimated: >60 mL/min (ref >60)
GFR, Estimated: >60 mL/min (ref >60)
GFR, Estimated: >60 mL/min (ref >60)
GFR, Estimated: >60 mL/min (ref >60)
Glucose, Bld: 375 mg/dL — ABNORMAL HIGH (ref 70–99)
Glucose, Bld: 186 mg/dL — ABNORMAL HIGH (ref 70–99)
Glucose, Bld: 140 mg/dL — ABNORMAL HIGH (ref 70–99)
Glucose, Bld: 124 mg/dL — ABNORMAL HIGH (ref 70–99)
Potassium: 3.7 mmol/L (ref 3.5–5.1)
Potassium: 3.8 mmol/L (ref 3.5–5.1)
Potassium: 3.5 mmol/L (ref 3.5–5.1)
Potassium: 3.4 mmol/L — ABNORMAL LOW (ref 3.5–5.1)
Sodium: 132 mmol/L — ABNORMAL LOW (ref 135–145)
Sodium: 132 mmol/L — ABNORMAL LOW (ref 135–145)
Sodium: 131 mmol/L — ABNORMAL LOW (ref 135–145)
Sodium: 134 mmol/L — ABNORMAL LOW (ref 135–145)

## 2024-08-13 LAB — URINALYSIS, ROUTINE W REFLEX MICROSCOPIC
Bacteria, UA: NONE SEEN
Bilirubin Urine: NEGATIVE
Glucose, UA: >=500 mg/dL — AB
Ketones, ur: 80 mg/dL — AB
Leukocytes,Ua: NEGATIVE
Nitrite: NEGATIVE
Protein, ur: NEGATIVE mg/dL
Specific Gravity, Urine: 1.022 (ref 1.005–1.030)
pH: 5 (ref 5.0–8.0)

## 2024-08-13 LAB — BLOOD GAS, VENOUS
Acid-base deficit: 10.2 mmol/L — ABNORMAL HIGH (ref 0.0–2.0)
Bicarbonate: 17.6 mmol/L — ABNORMAL LOW (ref 20.0–28.0)
Drawn by: 6051
O2 Saturation: 40 %
Patient temperature: 36.7
pCO2, Ven: 44 mmHg (ref 44–60)
pH, Ven: 7.2 — ABNORMAL LOW (ref 7.25–7.43)
pO2, Ven: <31 mmHg — CL (ref 32–45)

## 2024-08-13 LAB — CBC WITH DIFFERENTIAL/PLATELET
Abs Immature Granulocytes: 0.01 K/uL (ref 0.00–0.07)
Basophils Absolute: 0 K/uL (ref 0.0–0.1)
Basophils Relative: 1 %
Eosinophils Absolute: 0 K/uL (ref 0.0–0.5)
Eosinophils Relative: 1 %
HCT: 39.7 % (ref 36.0–46.0)
Hemoglobin: 12.7 g/dL (ref 12.0–15.0)
Immature Granulocytes: 0 %
Lymphocytes Relative: 29 %
Lymphs Abs: 1.2 K/uL (ref 0.7–4.0)
MCH: 26.7 pg (ref 26.0–34.0)
MCHC: 32 g/dL (ref 30.0–36.0)
MCV: 83.6 fL (ref 80.0–100.0)
Monocytes Absolute: 0.3 K/uL (ref 0.1–1.0)
Monocytes Relative: 7 %
Neutro Abs: 2.7 K/uL (ref 1.7–7.7)
Neutrophils Relative %: 62 %
Platelets: 216 K/uL (ref 150–400)
RBC: 4.75 MIL/uL (ref 3.87–5.11)
RDW: 12.7 % (ref 11.5–15.5)
WBC: 4.3 K/uL (ref 4.0–10.5)
nRBC: 0 % (ref 0.0–0.2)

## 2024-08-13 LAB — PHOSPHORUS: Phosphorus: 1.8 mg/dL — ABNORMAL LOW (ref 2.5–4.6)

## 2024-08-13 LAB — CBG MONITORING, ED
Glucose-Capillary: 365 mg/dL — ABNORMAL HIGH (ref 70–99)
Glucose-Capillary: 335 mg/dL — ABNORMAL HIGH (ref 70–99)

## 2024-08-13 LAB — GLUCOSE, CAPILLARY
Glucose-Capillary: 113 mg/dL — ABNORMAL HIGH (ref 70–99)
Glucose-Capillary: 116 mg/dL — ABNORMAL HIGH (ref 70–99)
Glucose-Capillary: 121 mg/dL — ABNORMAL HIGH (ref 70–99)
Glucose-Capillary: 128 mg/dL — ABNORMAL HIGH (ref 70–99)
Glucose-Capillary: 141 mg/dL — ABNORMAL HIGH (ref 70–99)
Glucose-Capillary: 163 mg/dL — ABNORMAL HIGH (ref 70–99)
Glucose-Capillary: 174 mg/dL — ABNORMAL HIGH (ref 70–99)
Glucose-Capillary: 175 mg/dL — ABNORMAL HIGH (ref 70–99)
Glucose-Capillary: 179 mg/dL — ABNORMAL HIGH (ref 70–99)
Glucose-Capillary: 185 mg/dL — ABNORMAL HIGH (ref 70–99)
Glucose-Capillary: 234 mg/dL — ABNORMAL HIGH (ref 70–99)

## 2024-08-13 LAB — MAGNESIUM: Magnesium: 1.7 mg/dL (ref 1.7–2.4)

## 2024-08-13 LAB — BETA-HYDROXYBUTYRIC ACID
Beta-Hydroxybutyric Acid: 4.48 mmol/L — ABNORMAL HIGH (ref 0.05–0.27)
Beta-Hydroxybutyric Acid: 3.11 mmol/L — ABNORMAL HIGH (ref 0.05–0.27)
Beta-Hydroxybutyric Acid: 1.65 mmol/L — ABNORMAL HIGH (ref 0.05–0.27)
Beta-Hydroxybutyric Acid: 2.62 mmol/L — ABNORMAL HIGH (ref 0.05–0.27)

## 2024-08-13 LAB — HIV ANTIBODY (ROUTINE TESTING W REFLEX): HIV Screen 4th Generation wRfx: NONREACTIVE

## 2024-08-13 LAB — PREGNANCY, URINE: Preg Test, Ur: NEGATIVE

## 2024-08-13 LAB — MRSA NEXT GEN BY PCR, NASAL: MRSA by PCR Next Gen: NOT DETECTED

## 2024-08-13 MED ORDER — DEXTROSE 50 % IV SOLN: 0.0000 mL | INTRAVENOUS | Status: DC | PRN

## 2024-08-13 MED ORDER — HYDROXYZINE HCL 25 MG PO TABS: 25.0000 mg | ORAL_TABLET | Freq: Three times a day (TID) | ORAL | Status: DC | PRN

## 2024-08-13 MED ORDER — LACTATED RINGERS IV SOLN: INTRAVENOUS | Status: AC

## 2024-08-13 MED ORDER — ACETAMINOPHEN 325 MG PO TABS: 650.0000 mg | ORAL_TABLET | Freq: Four times a day (QID) | ORAL | Status: DC | PRN

## 2024-08-13 MED ORDER — METHIMAZOLE 5 MG PO TABS
5.0000 mg | ORAL_TABLET | Freq: Every day | ORAL | Status: DC
  Administered 2024-08-13 – 2024-08-14 (×2): 5 mg via ORAL
  Filled 2024-08-13 (×2): qty 1

## 2024-08-13 MED ORDER — POTASSIUM CHLORIDE 10 MEQ/100ML IV SOLN
10.0000 meq | Freq: Once | INTRAVENOUS | Status: AC
  Administered 2024-08-13: 10 meq via INTRAVENOUS
  Filled 2024-08-13: qty 100

## 2024-08-13 MED ORDER — DEXTROSE IN LACTATED RINGERS 5 % IV SOLN: INTRAVENOUS | Status: DC

## 2024-08-13 MED ORDER — POTASSIUM CHLORIDE CRYS ER 20 MEQ PO TBCR
40.0000 meq | EXTENDED_RELEASE_TABLET | Freq: Once | ORAL | Status: AC
  Administered 2024-08-13: 40 meq via ORAL
  Filled 2024-08-13: qty 2

## 2024-08-13 MED ORDER — ATORVASTATIN CALCIUM 10 MG PO TABS
10.0000 mg | ORAL_TABLET | Freq: Every day | ORAL | Status: DC
  Administered 2024-08-13 – 2024-08-14 (×2): 10 mg via ORAL
  Filled 2024-08-13 (×2): qty 1

## 2024-08-13 MED ORDER — ENOXAPARIN SODIUM 40 MG/0.4ML IJ SOSY
40.0000 mg | PREFILLED_SYRINGE | INTRAMUSCULAR | Status: DC
  Administered 2024-08-13: 40 mg via SUBCUTANEOUS
  Filled 2024-08-13: qty 0.4

## 2024-08-13 MED ORDER — ONDANSETRON HCL 4 MG PO TABS: 4.0000 mg | ORAL_TABLET | Freq: Four times a day (QID) | ORAL | Status: DC | PRN

## 2024-08-13 MED ORDER — INSULIN REGULAR(HUMAN) IN NACL 100-0.9 UT/100ML-% IV SOLN
INTRAVENOUS | Status: DC
  Administered 2024-08-13: 10 [IU]/h via INTRAVENOUS
  Filled 2024-08-13: qty 100

## 2024-08-13 MED ORDER — PANTOPRAZOLE SODIUM 40 MG PO TBEC
40.0000 mg | DELAYED_RELEASE_TABLET | Freq: Every day | ORAL | Status: DC
  Administered 2024-08-13 – 2024-08-14 (×2): 40 mg via ORAL
  Filled 2024-08-13 (×2): qty 1

## 2024-08-13 MED ORDER — INSULIN GLARGINE-YFGN 100 UNIT/ML ~~LOC~~ SOLN
10.0000 [IU] | Freq: Two times a day (BID) | SUBCUTANEOUS | Status: DC
  Administered 2024-08-13 – 2024-08-14 (×2): 10 [IU] via SUBCUTANEOUS
  Filled 2024-08-13 (×4): qty 0.1

## 2024-08-13 MED ORDER — LACTATED RINGERS IV BOLUS
20.0000 mL/kg | Freq: Once | INTRAVENOUS | Status: AC
  Administered 2024-08-13: 1652 mL via INTRAVENOUS

## 2024-08-13 MED ORDER — ONDANSETRON HCL 4 MG/2ML IJ SOLN
4.0000 mg | Freq: Four times a day (QID) | INTRAMUSCULAR | Status: DC | PRN
Start: 2024-08-13 — End: 2024-08-14

## 2024-08-13 MED ORDER — CHLORHEXIDINE GLUCONATE CLOTH 2 % EX PADS
6.0000 | MEDICATED_PAD | Freq: Every day | CUTANEOUS | Status: DC
  Administered 2024-08-13 – 2024-08-14 (×2): 6 via TOPICAL

## 2024-08-13 MED ORDER — INSULIN ASPART 100 UNIT/ML IJ SOLN: 0.0000 [IU] | Freq: Every day | INTRAMUSCULAR | Status: DC

## 2024-08-13 MED ORDER — ACETAMINOPHEN 650 MG RE SUPP: 650.0000 mg | Freq: Four times a day (QID) | RECTAL | Status: DC | PRN

## 2024-08-13 MED ORDER — PROGESTERONE 200 MG PO CAPS
200.0000 mg | ORAL_CAPSULE | Freq: Every day | ORAL | Status: DC
  Administered 2024-08-13: 200 mg via ORAL
  Filled 2024-08-13 (×2): qty 1

## 2024-08-13 MED ORDER — ONDANSETRON HCL 4 MG/2ML IJ SOLN
4.0000 mg | Freq: Once | INTRAMUSCULAR | Status: AC
  Administered 2024-08-13: 4 mg via INTRAVENOUS
  Filled 2024-08-13: qty 2

## 2024-08-13 MED ORDER — INSULIN ASPART 100 UNIT/ML IJ SOLN
0.0000 [IU] | Freq: Three times a day (TID) | INTRAMUSCULAR | Status: DC
  Administered 2024-08-14: 11 [IU] via SUBCUTANEOUS

## 2024-08-13 NOTE — ED Triage Notes (Signed)
 Pt via POV c/o n/v and hyperglycemia for several days. Actively vomiting at time of arrival. Pt is type 1 DM with CGM and insulin  pump but has been unable to get her blood sugar to regulate. Pale on arrival.

## 2024-08-13 NOTE — H&P (Signed)
 History and Physical    Patient: Penny Andrade FMW:[id number] DOB: Jan 14, 1976 DOA: 08/13/2024 DOS: the patient was seen and examined on 08/13/2024 PCP: Katrinka Garnette KIDD, MD   Patient coming from: Home  Chief Complaint:  Chief Complaint  Patient presents with   Hyperglycemia   Emesis   HPI: Penny Andrade is a 48 y.o. 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  According to the patient symptoms have been present for the last 24 hours or so.  Patient has been reasonably good things down and despite bolusing insulin  her sugar levels continue to rise.  Patient reports good compliance with medications and is currently using insulin  pump and continuous glucose monitoring.   No fever, no chest pain, no hematemesis, no melena, no hematochezia, denies any dysuria or hematuria and expressed no sick contacts or focal weakness.  In the ED workup demonstrated metabolic acidosis with a bicarb of 15, anion gap of 20 and blood sugar in the 370 range milligram treated for DKA.    -Fluid resuscitation and insulin  drip has been started; TRH contacted to place patient in the hospital for further evaluation and management.  Review of Systems: As mentioned in the history of present illness. All other systems reviewed and are negative. Past Medical History:  Diagnosis Date   Alopecia    CCCA   Atypical chest pain 08/27/2023   Diabetes mellitus without complication (HCC)    diagnosed at 48 years old 10. Dr. Reyes Alexander   Exertional dyspnea 08/27/2023   Syncope and collapse 08/27/2023   UTI (urinary tract infection)    Past Surgical History:  Procedure Laterality Date   none     Social History:  reports that she has never smoked. She has never used smokeless tobacco. She reports current alcohol use. She reports that she does not use  drugs.  Allergies  Allergen Reactions   Meloxicam Other (See Comments)    Other Reaction: itchy    Family History  Problem Relation Age of Onset   Hypertension Mother    Arthritis Mother    Hyperlipidemia Father    Hypertension Father    Atrial fibrillation Father        cardioversion and ablation.    Stroke Father    Multiple myeloma Brother        passed at 51 from this   Breast cancer Paternal Grandmother    Pancreatic cancer Paternal Grandfather    Heart attack Paternal Uncle    Stroke Paternal Uncle     Prior to Admission medications   Medication Sig Start Date End Date Taking? Authorizing Provider  Adapalene -Benzoyl Peroxide  0.1-2.5 % gel APPLY EXTERNALLY TO THE AFFECTED AREA AT BEDTIME 08/26/20   Katrinka Garnette KIDD, MD  atorvastatin  (LIPITOR) 10 MG tablet Take 1 tablet (10 mg total) by mouth daily. 06/21/23     Continuous Glucose Receiver (DEXCOM G7 RECEIVER) DEVI use as directed 10/15/23     Continuous Glucose Sensor (DEXCOM G7 SENSOR) MISC change sensor every 10 days 02/02/24     estradiol  (VIVELLE -DOT) 0.05 MG/24HR patch Place 1 patch (0.05 mg total) onto the skin 2 (two) times a week. 05/18/24     hydrOXYzine  (ATARAX ) 25 MG tablet Take 1 tablet (25 mg total) by mouth 2 (two) times daily as needed. 07/28/24   Katrinka Garnette KIDD, MD  Insulin  Disposable Pump (OMNIPOD 5 DEXG7G6 PODS  GEN 5) MISC Use as directed, changing pod every 3 days. 07/31/24   Faythe Purchase, MD  Insulin  Disposable Pump (OMNIPOD 5 G7 INTRO, GEN 5,) KIT use as directed changing pod every 3 days 10/15/23     Insulin  Disposable Pump (OMNIPOD 5 G7 PODS, GEN 5,) MISC use as directed changing pod every 3 days 10/15/23     Insulin  Lispro-aabc (LYUMJEV ) 100 UNIT/ML SOLN Inject 70 Units into the skin daily. 05/10/23     Insulin  Lispro-aabc (LYUMJEV ) 100 UNIT/ML SOLN Inject up to 70 units/day via insulin  pump 06/21/23     methimazole  (TAPAZOLE ) 10 MG tablet Take 5 mg by mouth daily.  12/23/18   [provider]   pantoprazole  (PROTONIX ) 40 MG tablet Take 1 tablet (40 mg total) by mouth daily. 10/29/20   Katrinka Garnette KIDD, MD  phentermine (ADIPEX-P) 37.5 MG tablet Take 37.5 mg by mouth daily. 04/19/23   [provider]  progesterone  (PROMETRIUM ) 200 MG capsule Take 1 capsule (200 mg total) by mouth at bedtime. 05/18/24   Morris, Megan, DO  propranolol  (INDERAL ) 20 MG tablet Take 1 tablet (20 mg total) by mouth 2 (two) times daily as needed (for anxiety). 10/29/23   Katrinka Garnette KIDD, MD  tirzepatide  (ZEPBOUND ) 2.5 MG/0.5ML Pen Inject 2.5 mg into the skin once a week. 02/10/24     TUBERCULIN SYR 1CC/25GX5/8 25G X 5/8 1 ML MISC Inject 1 each into the skin once a week. 03/13/24       Physical Exam: Vitals:   08/13/24 0821 08/13/24 0823  BP:  (!) 143/73  Pulse:  (!) 114  Resp:  18  Temp:  98 F (36.7 C)  TempSrc:  Oral  SpO2:  96%  Weight: 82.6 kg   Height: 5' 11 (1.803 m)    General exam: Alert, awake, oriented x 3 Respiratory system: Clear to auscultation. Respiratory effort normal. Cardiovascular system:RRR. No murmurs, rubs, gallops. Gastrointestinal system: Abdomen is nondistended, soft and nontender. No organomegaly or masses felt. Normal bowel sounds heard. Central nervous system: Alert and oriented. No focal neurological deficits. Extremities: No C/C/E, +pedal pulses Skin: No rashes, lesions or ulcers Psychiatry: Judgement and insight appear normal. Mood & affect appropriate.    Data Reviewed: Basic metabolic panel: Sodium 133, potassium 3.7, chloride 97, bicarb 15, CBG 375, BUN 15, creatinine 1.02 and anion gap 20 Beta hydroxybutyric acid: 4.48 CBC: White blood cells 4.3, hemoglobin 12.7, platelet count 216K  Assessment and Plan: 1-type 1 diabetes with DKA/hyperglycemia -Recent A1c 8.1 - Will provide IV fluid resuscitation, electrolyte repletion, insulin  drip until DKA resolves and N.p.o. status -.  Once stable patient will be transition sliding scale insulin  and long-acting  insulin . -will provide antiemetics.  2-natremia in the setting of hyperglycemia - Will provide fluid resuscitation and control CBGs - Follow electrolytes trend.  3-hyperlipidemia - Continue statin  4-hypothyroidism - Continue Tapazole   5-GERD - Continue PPI  6-class I obesity - Low-carb diet, portion control and increased activity discussed with patient. -Body mass index is 25.38 kg/m.     Advance Care Planning:   Code Status: Full Code   Consults: none   Family Communication: parents at bedside   Severity of Illness: The appropriate patient status for this patient is OBSERVATION. Observation status is judged to be reasonable and necessary in order to provide the required intensity of service to ensure the patient's safety. The patient's presenting symptoms, physical exam findings, and initial radiographic and laboratory data in the context of their medical condition is  felt to place them at decreased risk for further clinical deterioration. Furthermore, it is anticipated that the patient will be medically stable for discharge from the hospital within 2 midnights of admission.   Author: Eric Nunnery, MD 08/13/2024 10:59 AM  For on call review [email or web address].

## 2024-08-13 NOTE — ED Provider Notes (Signed)
 [place] EMERGENCY DEPARTMENT AT [facility] Provider Note   CSN: [id number] Arrival date & time: 08/13/24  9189     Patient presents with: Hyperglycemia and Emesis   LELA MURFIN is a 48 y.o. female.    Hyperglycemia Associated symptoms: vomiting   Emesis  This patient is a very pleasant and kind 48 year old female, she has a history of type 1 diabetes on an insulin  pump, she is also on Zepbound  which she has been taking for a few months, this started back in February about 6 months ago.  She took her last dose approximately 1 week ago.  She has never had these kind of symptoms on that medication however over the last several days the patient has noted some increasing nausea, generalized weakness and a rising blood sugar.  She has been using her insulin  pump to give herself doses of insulin  and even this morning gave herself a bolus when she found her blood sugar to be high but feels like it is not coming down still well over 300.  On arrival the patient is pale, tachycardic and nauseated with active dry heaving in triage.  She denies fever, cough, shortness of breath, sore throat, runny nose, dysuria, diarrhea, rashes or recent travel or exposure to sick people.    Prior to Admission medications   Medication Sig Start Date End Date Taking? Authorizing Provider  Adapalene -Benzoyl Peroxide  0.1-2.5 % gel APPLY EXTERNALLY TO THE AFFECTED AREA AT BEDTIME 08/26/20   Katrinka Garnette KIDD, MD  atorvastatin  (LIPITOR) 10 MG tablet Take 1 tablet (10 mg total) by mouth daily. 06/21/23     Continuous Glucose Receiver (DEXCOM G7 RECEIVER) DEVI use as directed 10/15/23     Continuous Glucose Sensor (DEXCOM G7 SENSOR) MISC change sensor every 10 days 02/02/24     doxycycline  (VIBRA -TABS) 100 MG tablet Take 1 tablet (100 mg total) by mouth 2 (two) times daily. 03/22/23   Katrinka Garnette KIDD, MD  estradiol  (VIVELLE -DOT) 0.05 MG/24HR patch Place 1 patch (0.05 mg total) onto the skin 2 (two)  times a week. 05/18/24     hydrOXYzine  (ATARAX ) 25 MG tablet Take 1 tablet (25 mg total) by mouth 2 (two) times daily as needed. 07/28/24   Katrinka Garnette KIDD, MD  Insulin  Disposable Pump (OMNIPOD 5 DEXG7G6 PODS GEN 5) MISC Use as directed, changing pod every 3 days. 07/31/24   Faythe Purchase, MD  Insulin  Disposable Pump (OMNIPOD 5 G7 INTRO, GEN 5,) KIT use as directed changing pod every 3 days 10/15/23     Insulin  Disposable Pump (OMNIPOD 5 G7 PODS, GEN 5,) MISC use as directed changing pod every 3 days 10/15/23     Insulin  Lispro-aabc (LYUMJEV ) 100 UNIT/ML SOLN Inject 70 Units into the skin daily. 05/10/23     Insulin  Lispro-aabc (LYUMJEV ) 100 UNIT/ML SOLN Inject up to 70 units/day via insulin  pump 06/21/23     ipratropium (ATROVENT ) 0.03 % nasal spray Place 2 sprays into both nostrils every 12 (twelve) hours. 12/28/21   Vivienne Delon HERO, PA-C  methimazole  (TAPAZOLE ) 10 MG tablet Take 5 mg by mouth daily.  12/23/18   [provider]  pantoprazole  (PROTONIX ) 40 MG tablet Take 1 tablet (40 mg total) by mouth daily. 10/29/20   Katrinka Garnette KIDD, MD  phentermine (ADIPEX-P) 37.5 MG tablet Take 37.5 mg by mouth daily. 04/19/23   [provider]  progesterone  (PROMETRIUM ) 200 MG capsule Take 1 capsule (200 mg total) by mouth at bedtime. 05/18/24   Morris, Megan,  DO  propranolol  (INDERAL ) 20 MG tablet Take 1 tablet (20 mg total) by mouth 2 (two) times daily as needed (for anxiety). 10/29/23   Katrinka Garnette KIDD, MD  tirzepatide  (ZEPBOUND ) 2.5 MG/0.5ML Pen Inject 2.5 mg into the skin once a week. 02/10/24     TUBERCULIN SYR 1CC/25GX5/8 25G X 5/8 1 ML MISC Inject 1 each into the skin once a week. 03/13/24       Allergies: Meloxicam    Review of Systems  Gastrointestinal:  Positive for vomiting.  All other systems reviewed and are negative.   Updated Vital Signs BP (!) 143/73 (BP Location: Left Arm)   Pulse (!) 114   Temp 98 F (36.7 C) (Oral)   Resp 18   Ht 1.803 m (5' 11)   Wt 82.6 kg    SpO2 96%   BMI 25.38 kg/m   Physical Exam Vitals and nursing note reviewed.  Constitutional:      Appearance: She is well-developed.     Comments: Uncomfortable appearing  HENT:     Head: Normocephalic and atraumatic.     Mouth/Throat:     Pharynx: No oropharyngeal exudate.  Eyes:     General: No scleral icterus.       Right eye: No discharge.        Left eye: No discharge.     Conjunctiva/sclera: Conjunctivae normal.     Pupils: Pupils are equal, round, and reactive to light.  Neck:     Thyroid : No thyromegaly.     Vascular: No JVD.  Cardiovascular:     Rate and Rhythm: Regular rhythm. Tachycardia present.     Heart sounds: Normal heart sounds. No murmur heard.    No friction rub. No gallop.  Pulmonary:     Effort: Pulmonary effort is normal. No respiratory distress.     Breath sounds: Normal breath sounds. No wheezing or rales.  Abdominal:     General: Bowel sounds are normal. There is no distension.     Palpations: Abdomen is soft. There is no mass.     Tenderness: There is no abdominal tenderness.  Musculoskeletal:        General: No tenderness. Normal range of motion.     Cervical back: Normal range of motion and neck supple.     Right lower leg: No edema.     Left lower leg: No edema.  Lymphadenopathy:     Cervical: No cervical adenopathy.  Skin:    General: Skin is warm and dry.     Findings: No erythema or rash.  Neurological:     Coordination: Coordination normal.  Psychiatric:        Behavior: Behavior normal.     (all labs ordered are listed, but only abnormal results are displayed) Labs Reviewed  BASIC METABOLIC PANEL WITH GFR - Abnormal; Notable for the following components:      Result Value   Sodium 132 (*)    Chloride 97 (*)    CO2 15 (*)    Glucose, Bld 375 (*)    Creatinine, Ser 1.02 (*)    Anion gap 20 (*)    All other components within normal limits  CBG MONITORING, ED - Abnormal; Notable for the following components:    Glucose-Capillary 365 (*)    All other components within normal limits  CBC WITH DIFFERENTIAL/PLATELET  BASIC METABOLIC PANEL WITH GFR  BASIC METABOLIC PANEL WITH GFR  BASIC METABOLIC PANEL WITH GFR  BETA-HYDROXYBUTYRIC ACID  BETA-HYDROXYBUTYRIC ACID  BETA-HYDROXYBUTYRIC  ACID  BETA-HYDROXYBUTYRIC ACID  URINALYSIS, ROUTINE W REFLEX MICROSCOPIC  BLOOD GAS, VENOUS  PREGNANCY, URINE  CBG MONITORING, ED  POC URINE PREG, ED    EKG: EKG Interpretation Date/Time:  Sunday August 13 2024 08:47:03 EDT Ventricular Rate:  104 PR Interval:  181 QRS Duration:  83 QT Interval:  352 QTC Calculation: 463 R Axis:   66  Text Interpretation: Sinus tachycardia Consider left atrial enlargement Probable anteroseptal infarct, old Confirmed by Cleotilde Rogue ([id number]) on 08/13/2024 8:49:30 AM  Radiology: DG Chest Portable 1 View Result Date: 08/13/2024 EXAM: 1 VIEW XRAY OF THE CHEST 08/13/2024 08:41:00 AM COMPARISON: 05/07/2023 CLINICAL HISTORY: DKA. Per chart: Pt via POV c/o n/v and hyperglycemia for several days. Actively vomiting at time of arrival. Pt is type 1 DM with CGM and insulin  pump but has been unable to get her blood sugar to regulate. Pale on arrival. FINDINGS: LUNGS AND PLEURA: No focal pulmonary opacity. No pulmonary edema. No pleural effusion. No pneumothorax. HEART AND MEDIASTINUM: No acute abnormality of the cardiac and mediastinal silhouettes. BONES AND SOFT TISSUES: No acute osseous abnormality. IMPRESSION: 1. No acute process. Electronically signed by: Waddell Calk MD 08/13/2024 09:06 AM EDT RP Workstation: [id number]     .Critical Care  Performed by: Cleotilde Rogue, MD Authorized by: Cleotilde Rogue, MD   Critical care provider statement:    Critical care time (minutes):  45   Critical care time was exclusive of:  Separately billable procedures and treating other patients and teaching time   Critical care was necessary to treat or prevent imminent or life-threatening deterioration of  the following conditions:  Endocrine crisis   Critical care was time spent personally by me on the following activities:  Development of treatment plan with patient or surrogate, discussions with consultants, evaluation of patient's response to treatment, examination of patient, obtaining history from patient or surrogate, review of old charts, re-evaluation of patient's condition, pulse oximetry, ordering and review of radiographic studies, ordering and review of laboratory studies and ordering and performing treatments and interventions   I assumed direction of critical care for this patient from another provider in my specialty: no     Care discussed with: admitting provider   Comments:          Medications Ordered in the ED  lactated ringers  bolus 1,652 mL (has no administration in time range)  insulin  regular, human (MYXREDLIN ) 100 units/ 100 mL infusion (has no administration in time range)  lactated ringers  infusion (has no administration in time range)  dextrose  5 % in lactated ringers  infusion (0 mLs Intravenous Hold 08/13/24 0835)  dextrose  50 % solution 0-50 mL (has no administration in time range)  potassium chloride  10 mEq in 100 mL IVPB (has no administration in time range)    Clinical Course as of 08/13/24 0958  Sun Aug 13, 2024  0903 X-ray unremarkable, blood sugar 365, CBC reassuring [BM]  0926 Potassium given, insulin  started, patient had difficult IV access and required IV ultrasound by staff, still tachycardic, will admit for DKA [BM]    Clinical Course User Index [BM] Cleotilde Rogue, MD                                 Medical Decision Making Amount and/or Complexity of Data Reviewed Labs: ordered. Radiology: ordered.  Risk Prescription drug management. Decision regarding hospitalization.    This patient presents to the ED for concern of hyperglycemia, this  involves an extensive number of treatment options, and is a complaint that carries with it a high risk  of complications and morbidity.  The differential diagnosis includes DKA, underlying infection or other cause of the DKA, could be an insulin  pump failure, could be cardiac cause although the patient is essentially symptom-free other than nausea and weakness   Co morbidities / Chronic conditions that complicate the patient evaluation  Type 1 diabetes   Additional history obtained:  Additional history obtained from EMR External records from outside source obtained and reviewed including prior office visits for type 1 diabetes   Lab Tests:  I Ordered, and personally interpreted labs.  The pertinent results include: Increased anion gap acidosis, CO2 of 15, gap of 20   Imaging Studies ordered:  I ordered imaging studies including chest x-ray I independently visualized and interpreted imaging which showed no acute findings I agree with the radiologist interpretation   Cardiac Monitoring: / EKG:  The patient was maintained on a cardiac monitor.  I personally viewed and interpreted the cardiac monitored which showed an underlying rhythm of: Sinus tachycardia   Problem List / ED Course / Critical interventions / Medication management  DKA, I ordered medication including potassium supplementation due to a potassium of 3.7, insulin  drip and IV fluids Reevaluation of the patient after these medicines showed that the patient slow improvement I have reviewed the patients home medicines and have made adjustments as needed   Consultations Obtained:  I requested consultation with the hospitalist,  and discussed lab and imaging findings as well as pertinent plan - they recommend: Admission to the hospital, critically ill with DKA   Social Determinants of Health:  Type I diabetic   Test / Admission - Considered:  Admit to high level of care      Final diagnoses:  Diabetic ketoacidosis without coma associated with type 1 diabetes mellitus [facility])    ED Discharge Orders      None          Cleotilde Rogue, MD 09/02/24 1458

## 2024-08-14 ENCOUNTER — Other Ambulatory Visit: Payer: Self-pay

## 2024-08-14 ENCOUNTER — Other Ambulatory Visit (HOSPITAL_COMMUNITY): Payer: Self-pay

## 2024-08-14 DIAGNOSIS — E1010 Type 1 diabetes mellitus with ketoacidosis without coma: Secondary | ICD-10-CM | POA: Diagnosis not present

## 2024-08-14 LAB — BASIC METABOLIC PANEL WITH GFR
Anion gap: 8 (ref 5–15)
BUN: 9 mg/dL (ref 6–20)
CO2: 19 mmol/L — ABNORMAL LOW (ref 22–32)
Calcium: 8.4 mg/dL — ABNORMAL LOW (ref 8.9–10.3)
Chloride: 107 mmol/L (ref 98–111)
Creatinine, Ser: 0.75 mg/dL (ref 0.44–1.00)
GFR, Estimated: >60 mL/min (ref >60)
Glucose, Bld: 236 mg/dL — ABNORMAL HIGH (ref 70–99)
Potassium: 4.1 mmol/L (ref 3.5–5.1)
Sodium: 134 mmol/L — ABNORMAL LOW (ref 135–145)

## 2024-08-14 LAB — BETA-HYDROXYBUTYRIC ACID: Beta-Hydroxybutyric Acid: 3.8 mmol/L — ABNORMAL HIGH (ref 0.05–0.27)

## 2024-08-14 LAB — GLUCOSE, CAPILLARY: Glucose-Capillary: 315 mg/dL — ABNORMAL HIGH (ref 70–99)

## 2024-08-14 MED ORDER — PANTOPRAZOLE SODIUM 40 MG PO TBEC
40.0000 mg | DELAYED_RELEASE_TABLET | Freq: Every day | ORAL | 1 refills | Status: AC
  Filled 2024-08-14: qty 30, 30d supply, fill #0

## 2024-08-14 NOTE — Inpatient Diabetes Management (Addendum)
 Inpatient Diabetes Program Recommendations  AACE/ADA: New Consensus Statement on Inpatient Glycemic Control   Target Ranges:  Prepandial:   less than 140 mg/dL      Peak postprandial:   less than 180 mg/dL (1-2 hours)      Critically ill patients:  140 - 180 mg/dL    Latest Reference Range & Units 08/13/24 16:52 08/13/24 18:18 08/13/24 19:21 08/13/24 20:24 08/13/24 21:24 08/13/24 22:32 08/13/24 23:32  Glucose-Capillary 70 - 99 mg/dL 858 (H) 883 (H) 878 (H) 113 (H) 128 (H) 175 (H) 174 (H)    Latest Reference Range & Units 08/13/24 08:35 08/13/24 12:47 08/13/24 16:57 08/13/24 20:49 08/14/24 05:22  CO2 22 - 32 mmol/L 15 (L) 18 (L) 19 (L) 19 (L) 19 (L)  Glucose 70 - 99 mg/dL 624 (H) 813 (H) 859 (H) 124 (H) 236 (H)  Anion gap 5 - 15  20 (H) 11 7 9 8     Latest Reference Range & Units 08/13/24 08:35 08/13/24 12:47 08/13/24 16:57 08/13/24 20:49 08/14/24 05:22  Beta-Hydroxybutyric Acid 0.05 - 0.27 mmol/L 4.48 (H) 3.11 (H) 1.65 (H) 2.62 (H) 3.80 (H)   Review of Glycemic Control  Diabetes history: DM1 Outpatient Diabetes medications: OmniPod insulin  pump with Lyumjev  insulin ; Dexcom G7 CGM Current orders for Inpatient glycemic control: Semglee  10 units BID, Novolog  0-15 units TID with meals, Novolog  0-5 units QHS  Inpatient Diabetes Program Recommendations:    Insulin : Noted patient was transitioned from IV to SQ insulin , was given Semglee  10 units at 21:25 on 08/13/24 and beta-hydroxybutyric acid was 2.62 at 20:49 on 8/17 and up to 3.80 at 5:22 am today.    NOTE: Patient admitted with DKA, has a hx of Type 1 DM and uses an insulin  pump outpatient. Per chart, patient sees Dr. Faythe (Endocrinologist) and was last seen on 06/09/24.  Will plan to speak with patient today regarding DM and insulin  pump.  Addendum 08/14/24@12 :15-Patient already discharged home today. Noted patient received Semglee  10 units at 9:44 am and Novolog  11 units at 8:16 am today.  Called patient over the phone. Patient reports  that after the doctor seen her this morning, she was told she could restart her insulin  pump since she was discharging home. Patient reports that she resumed her insulin  pump prior to getting SQ Semglee  today. Discussed that she received Semglee  10 units at 21:25 last night and Semglee  10 units at 9:44 am today. Therefore, the Semglee  will work for about 24 hours and she may be at increased risk of hypoglycemia given she has resumed her insulin  pump. Patient states she has her Dexcom G7 on as well and she will keep a close eye on glucose and she can stop her insulin  delivery via her pump if needed. Patient reports that she uses the auto mode on her OmniPod so it should also adjust the amount of insulin  she is receiving if needed.  Patient reports she is unsure why she went into DKA. Discussed that it may have been an issue with her OmniPod pod if her glucose rose after changing out her OmniPod so she changed it out again but she could not get her glucose down despite boluses on her pump and changing out the pods.  Discussed that when that happens, she may want to take a SQ insulin  injection so she knows she is getting some insulin  as it could likely be an issue with the pod/or site itself. Encouraged patient to call OmniPod company to report potential pod failure, to make them  aware she was admitted with DKA, and to ask for replacement pods.  Patient appreciative of call and has no questions at this time.  Thanks, Earnie Gainer, RN, MSN, CDCES Diabetes Coordinator Inpatient Diabetes Program [phone] (Team Pager from 8am to 5pm)

## 2024-08-14 NOTE — Progress Notes (Signed)
 Patient discharged home. Reviewed discharge instructions with patient and all questions answered. Patient satisfied that all belongings were returned. Patient reapplied insulin  pump and semglee  given per MD order. IV removed and site intact.

## 2024-08-14 NOTE — Discharge Summary (Addendum)
 Physician Discharge Summary   Patient: Penny Andrade MRN: [id number] DOB: March 21, 1976  Admit date:     08/13/2024  Discharge date: 08/14/24  Discharge Physician: Eric Nunnery   PCP: Katrinka Garnette KIDD, MD   Recommendations at discharge:  Continue close monitoring of patient's CBG/A1c with further adjustment to hypoglycemia regimen as needed Repeat basic metabolic panel to follow electrolytes and renal function.  Discharge Diagnoses: Principal Problem:   DKA, type 1 (HCC) Hyponatremia Hyperlipidemia Hyperthyroidism GERD Class I obesity   Brief Hospital admission narrative: Penny Andrade is a 48 y.o. 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  According to the patient symptoms have been present for the last 24 hours or so.  Patient has been reasonably good things down and despite bolusing insulin  her sugar levels continue to rise.  Patient reports good compliance with medications and is currently using insulin  pump and continuous glucose monitoring.   No fever, no chest pain, no hematemesis, no melena, no hematochezia, denies any dysuria or hematuria and expressed no sick contacts or focal weakness.   In the ED workup demonstrated metabolic acidosis with a bicarb of 15, anion gap of 20 and blood sugar in the 370 range milligram treated for DKA.     -Fluid resuscitation and insulin  drip has been started; TRH contacted to place patient in the hospital for further evaluation and management.  Assessment and Plan: 1-type 1 diabetes with DKA/hyperglycemia - Recent A1c 8.1 -Excellent response to fluid resuscitation and insulin  drip - Once DKA process resolved patient was transition to sliding scale insulin  and Lantus  and eventually back to the use of her insulin  pump. - Patient instructed to keep herself well-hydrated and to  continue close monitoring of CBG fluctuation with outpatient follow-up by PCP/endocrinologist to further adjust her regimen. - Patient reports no nausea, no vomiting and tolerating diet without problems or discharge.  2-hyponatremia - In the setting of hyperglycemia and mild dehydration from GI losses - Electrolytes resolved and within normal limits at discharge.  3-hyperlipidemia - Continue statin.  4-hyperthyroidism - Continue methimazole   5-GERD - Continue PPI  6-class I obesity - Continue low-calorie diet and portion control - Patient with excellent response to the use of tirzepatide  -Body mass index is 25.38 kg/m.  - Continue patient follow-up with PCP.  Consultants: None Procedures performed: See below for x-ray report. Disposition: Home Diet recommendation: Modified carbohydrate diet.  DISCHARGE MEDICATION: Allergies as of 08/14/2024       Reactions   Meloxicam Other (See Comments)   Other Reaction: itchy        Medication List     STOP taking these medications    Adapalene -Benzoyl Peroxide  0.1-2.5 % gel   doxycycline  100 MG capsule Commonly known as: VIBRAMYCIN    phentermine 37.5 MG tablet Commonly known as: ADIPEX-P   propranolol  20 MG tablet Commonly known as: INDERAL        TAKE these medications    atorvastatin  10 MG tablet Commonly known as: LIPITOR Take 1 tablet (10 mg total) by mouth daily.   BD Syringe Slip Tip 25G X 5/8 1 ML Misc Generic drug: TUBERCULIN SYR 1CC/25GX5/8 Inject 1 each into the skin once a week.   Dexcom G7 Receiver Espiridion use as directed   Dexcom G7 Sensor Misc change sensor every 10 days   estradiol  0.05 MG/24HR patch Commonly known as: VIVELLE -DOT Place 1 patch (0.05  mg total) onto the skin 2 (two) times a week.   HAIR SKIN NAILS PO Take 1 tablet by mouth daily.   hydrOXYzine  25 MG tablet Commonly known as: ATARAX  Take 1 tablet (25 mg total) by mouth 2 (two) times daily as needed.   Lyumjev  100  UNIT/ML Soln Generic drug: Insulin  Lispro-aabc Inject up to 70 units/day via insulin  pump   methimazole  5 MG tablet Commonly known as: TAPAZOLE  Take 5 mg by mouth daily.   multivitamin tablet Take 1 tablet by mouth daily.   Omnipod 5 G7 Intro (Gen 5) Kit use as directed changing pod every 3 days   Omnipod 5 G7 Pods (Gen 5) Misc use as directed changing pod every 3 days   Omnipod 5 DexG7G6 Pods Gen 5 Misc Use as directed, changing pod every 3 days.   pantoprazole  40 MG tablet Commonly known as: PROTONIX  Take 1 tablet (40 mg total) by mouth daily. Start taking on: August 15, 2024   progesterone  200 MG capsule Commonly known as: PROMETRIUM  Take 1 capsule (200 mg total) by mouth at bedtime.   Zepbound  2.5 MG/0.5ML Pen Generic drug: tirzepatide  Inject 2.5 mg into the skin once a week. What changed:  how much to take when to take this        Follow-up Information     Katrinka Garnette KIDD, MD. Schedule an appointment as soon as possible for a visit in 10 day(s).   Specialty: Family Medicine Contact information: [address] [place] [address] [phone]                Discharge Exam: [name]   08/13/24 9178  Weight: 82.6 kg   General exam: Alert, awake, oriented x 3 Respiratory system: Clear to auscultation. Respiratory effort normal. Cardiovascular system:RRR. No murmurs, rubs, gallops. Gastrointestinal system: Abdomen is nondistended, soft and nontender. No organomegaly or masses felt. Normal bowel sounds heard. Central nervous system: Alert and oriented. No focal neurological deficits. Extremities: No C/C/E, +pedal pulses Skin: No rashes, lesions or ulcers Psychiatry: Judgement and insight appear normal. Mood & affect appropriate.    Condition at discharge: Stable and improved.  The results of significant diagnostics from this hospitalization (including imaging, microbiology, ancillary and laboratory) are listed below for reference.    Imaging Studies: DG Chest Portable 1 View Result Date: 08/13/2024 EXAM: 1 VIEW XRAY OF THE CHEST 08/13/2024 08:41:00 AM COMPARISON: 05/07/2023 CLINICAL HISTORY: DKA. Per chart: Pt via POV c/o n/v and hyperglycemia for several days. Actively vomiting at time of arrival. Pt is type 1 DM with CGM and insulin  pump but has been unable to get her blood sugar to regulate. Pale on arrival. FINDINGS: LUNGS AND PLEURA: No focal pulmonary opacity. No pulmonary edema. No pleural effusion. No pneumothorax. HEART AND MEDIASTINUM: No acute abnormality of the cardiac and mediastinal silhouettes. BONES AND SOFT TISSUES: No acute osseous abnormality. IMPRESSION: 1. No acute process. Electronically signed by: Waddell Calk MD 08/13/2024 09:06 AM EDT RP Workstation: [id number]    Microbiology: Results for orders placed or performed during the hospital encounter of 08/13/24  MRSA Next Gen by PCR, Nasal     Status: None   Collection Time: 08/13/24 11:50 AM   Specimen: Nasal Mucosa; Nasal Swab  Result Value Ref Range Status   MRSA by PCR Next Gen NOT DETECTED NOT DETECTED Final    Comment: (NOTE) The GeneXpert MRSA Assay (FDA approved for NASAL specimens only), is one component of a comprehensive MRSA colonization surveillance program. It is  not intended to diagnose MRSA infection nor to guide or monitor treatment for MRSA infections. Test performance is not FDA approved in patients less than 16 years old. Performed at [facility], [address] [place], [place] [address]     Labs: CBC: Recent Labs  Lab 08/13/24 0835  WBC 4.3  NEUTROABS 2.7  HGB 12.7  HCT 39.7  MCV 83.6  PLT 216   Basic Metabolic Panel: Recent Labs  Lab 08/13/24 0835 08/13/24 1247 08/13/24 1657 08/13/24 2049 08/14/24 0522  NA 132* 132* 131* 134* 134*  K 3.7 3.8 3.5 3.4* 4.1  CL 97* 103 105 106 107  CO2 15* 18* 19* 19* 19*  GLUCOSE 375* 186* 140* 124* 236*  BUN 15 14 12 10 9   CREATININE 1.02* 0.85 0.74 0.73 0.75   CALCIUM  9.3 8.7* 8.5* 8.6* 8.4*  MG  --  1.7  --   --   --   PHOS  --  1.8*  --   --   --    Liver Function Tests: No results for input(s): AST, ALT, ALKPHOS, BILITOT, PROT, ALBUMIN in the last 168 hours.  CBG: Recent Labs  Lab 08/13/24 2024 08/13/24 2124 08/13/24 2232 08/13/24 2332 08/14/24 0811  GLUCAP 113* 128* 175* 174* 315*    Discharge time spent:  29 minutes.  Signed: Eric Nunnery, MD [facility] 08/14/2024

## 2024-08-14 NOTE — Plan of Care (Signed)
  Problem: Education: Goal: Knowledge of General Education information will improve Description: Including pain rating scale, medication(s)/side effects and non-pharmacologic comfort measures Outcome: Progressing   Problem: Health Behavior/Discharge Planning: Goal: Ability to manage health-related needs will improve Outcome: Progressing   Problem: Clinical Measurements: Goal: Ability to maintain clinical measurements within normal limits will improve Outcome: Progressing Goal: Will remain free from infection Outcome: Progressing Goal: Diagnostic test results will improve Outcome: Progressing Goal: Respiratory complications will improve Outcome: Progressing Goal: Cardiovascular complication will be avoided Outcome: Progressing   Problem: Nutrition: Goal: Adequate nutrition will be maintained Outcome: Progressing   Problem: Tissue Perfusion: Goal: Adequacy of tissue perfusion will improve Outcome: Progressing   Problem: Skin Integrity: Goal: Risk for impaired skin integrity will decrease Outcome: Progressing   Problem: Nutritional: Goal: Maintenance of adequate nutrition will improve Outcome: Progressing Goal: Progress toward achieving an optimal weight will improve Outcome: Progressing   Problem: Metabolic: Goal: Ability to maintain appropriate glucose levels will improve Outcome: Progressing   Problem: Health Behavior/Discharge Planning: Goal: Ability to identify and utilize available resources and services will improve Outcome: Progressing Goal: Ability to manage health-related needs will improve Outcome: Progressing   Problem: Fluid Volume: Goal: Ability to maintain a balanced intake and output will improve Outcome: Progressing   Problem: Coping: Goal: Ability to adjust to condition or change in health will improve Outcome: Progressing   Problem: Education: Goal: Ability to describe self-care measures that may prevent or decrease complications (Diabetes  Survival Skills Education) will improve Outcome: Progressing Goal: Individualized Educational Video(s) Outcome: Progressing   Problem: Skin Integrity: Goal: Risk for impaired skin integrity will decrease Outcome: Progressing   Problem: Safety: Goal: Ability to remain free from injury will improve Outcome: Progressing   Problem: Pain Managment: Goal: General experience of comfort will improve and/or be controlled Outcome: Progressing   Problem: Elimination: Goal: Will not experience complications related to bowel motility Outcome: Progressing Goal: Will not experience complications related to urinary retention Outcome: Progressing   Problem: Coping: Goal: Level of anxiety will decrease Outcome: Progressing

## 2024-08-15 ENCOUNTER — Telehealth: Payer: Self-pay

## 2024-08-15 NOTE — Transitions of Care (Post Inpatient/ED Visit) (Unsigned)
   08/15/2024  Name: Penny Andrade MRN: [id number] DOB: 1976/10/08  Today's TOC FU Call Status: Today's TOC FU Call Status:: Unsuccessful Call (1st Attempt) Unsuccessful Call (1st Attempt) Date: 08/15/24  Attempted to reach the patient regarding the most recent Inpatient/ED visit.  Follow Up Plan: Additional outreach attempts will be made to reach the patient to complete the Transitions of Care (Post Inpatient/ED visit) call.   Signature Julian Lemmings, LPN [facility] Nurse Health Advisor Direct Dial [phone]

## 2024-08-16 NOTE — Transitions of Care (Post Inpatient/ED Visit) (Signed)
 08/16/2024  Name: Penny Andrade MRN: [id number] DOB: October 27, 1976  Today's TOC FU Call Status: Today's TOC FU Call Status:: Successful TOC FU Call Completed Unsuccessful Call (1st Attempt) Date: 08/15/24 [facility] FU Call Complete Date: 08/16/24 Patient's Name and Date of Birth confirmed.  Transition Care Management Follow-up Telephone Call Date of Discharge: 08/14/24 [facility] Type of Discharge: Inpatient Admission Primary Inpatient Discharge Diagnosis:: DM How have you been since you were released from the hospital?: Better Any questions or concerns?: No  Items Reviewed: Did you receive and understand the discharge instructions provided?: Yes Medications obtained,verified, and reconciled?: Yes (Medications Reviewed) Any new allergies since your discharge?: No Dietary orders reviewed?: Yes Do you have support at home?: Yes People in Home [RPT]: spouse  Medications Reviewed Today: Medications Reviewed Today     Reviewed by Emmitt Pan, LPN (Licensed Practical Nurse) on 08/16/24 at 1136  Med List Status: <None>   Medication Order Taking? Sig Documenting Provider Last Dose Status Informant  atorvastatin  (LIPITOR) 10 MG tablet [id number] Yes Take 1 tablet (10 mg total) by mouth daily.   Active Self, Pharmacy Records  Continuous Glucose Receiver  [facility] G7 [place]) DEVI [id number] Yes use as directed   Active Self, Pharmacy Records  Continuous Glucose Sensor (DEXCOM G7 SENSOR) MISC [id number] Yes change sensor every 10 days   Active Self, Pharmacy Records  estradiol  (VIVELLE -DOT) 0.05 MG/24HR patch [id number] Yes Place 1 patch (0.05 mg total) onto the skin 2 (two) times a week.   Active Self, Pharmacy Records  hydrOXYzine  (ATARAX ) 25 MG tablet [id number] Yes Take 1 tablet (25 mg total) by mouth 2 (two) times daily as needed. Katrinka Garnette KIDD, MD  Active Self, Pharmacy Records  Insulin  Disposable Pump (OMNIPOD 5 DEXG7G6 PODS GEN 5) MISC [id number] Yes Use  as directed, changing pod every 3 days. Faythe Purchase, MD  Active Self, Pharmacy Records  Insulin  Disposable Pump (OMNIPOD 5 G7 INTRO, GEN 5,) KIT [id number] Yes use as directed changing pod every 3 days   Active Self, Pharmacy Records  Insulin  Disposable Pump (OMNIPOD 5 G7 PODS, GEN 5,) MISC [id number] Yes use as directed changing pod every 3 days   Active Self, Pharmacy Records  Insulin  Lispro-aabc (LYUMJEV ) 100 UNIT/ML SOLN [id number] Yes Inject up to 70 units/day via insulin  pump   Active Self, Pharmacy Records  methimazole  (TAPAZOLE ) 5 MG tablet [id number] Yes Take 5 mg by mouth daily. [provider]  Active Self, Pharmacy Records  Multiple Vitamin (MULTIVITAMIN) tablet [id number] Yes Take 1 tablet by mouth daily. [provider]  Active Self, Pharmacy Records  Multiple Vitamins-Minerals [facility] SKIN NAILS PO) [id number] Yes Take 1 tablet by mouth daily. [provider]  Active Self, Pharmacy Records  pantoprazole  (PROTONIX ) 40 MG tablet [id number] Yes Take 1 tablet (40 mg total) by mouth daily. Ricky Fines, MD  Active   progesterone  (PROMETRIUM ) 200 MG capsule [id number] Yes Take 1 capsule (200 mg total) by mouth at bedtime. Dannielle Bouchard, DO  Active Self, Pharmacy Records  tirzepatide  (ZEPBOUND ) 2.5 MG/0.5ML Pen [id number] Yes Inject 2.5 mg into the skin once a week.  Patient taking differently: Inject 5 mg into the skin every Sunday.     Active Self, Pharmacy Records  TUBERCULIN SYR 1CC/25GX5/8 25G X 5/8 1 ML MISC [id number] Yes Inject 1 each into the skin once a week.   Active Self, Pharmacy Records            Home Care and Equipment/Supplies: Were Home Health Services Ordered?:  NA Any new equipment or medical supplies ordered?: NA  Functional Questionnaire: Do you need assistance with bathing/showering or dressing?: No Do you need assistance with meal preparation?: No Do you need assistance with eating?: No Do you have difficulty maintaining continence:  No Do you need assistance with getting out of bed/getting out of a chair/moving?: No Do you have difficulty managing or taking your medications?: No  Follow up appointments reviewed: PCP Follow-up appointment confirmed?: No (declined) MD Provider Line Number:[phone] Given: No Specialist Hospital Follow-up appointment confirmed?: Yes Date of Specialist follow-up appointment?: 08/18/24 Follow-Up Specialty Provider:: Endo Do you need transportation to your follow-up appointment?: No Do you understand care options if your condition(s) worsen?: Yes-patient verbalized understanding    SIGNATURE Julian Lemmings, LPN [facility] Nurse Health Advisor Direct Dial [phone]

## 2024-08-18 DIAGNOSIS — Z794 Long term (current) use of insulin: Secondary | ICD-10-CM | POA: Diagnosis not present

## 2024-08-18 DIAGNOSIS — E0590 Thyrotoxicosis, unspecified without thyrotoxic crisis or storm: Secondary | ICD-10-CM | POA: Diagnosis not present

## 2024-08-18 DIAGNOSIS — E876 Hypokalemia: Secondary | ICD-10-CM | POA: Diagnosis not present

## 2024-08-18 DIAGNOSIS — E0500 Thyrotoxicosis with diffuse goiter without thyrotoxic crisis or storm: Secondary | ICD-10-CM | POA: Diagnosis not present

## 2024-08-18 DIAGNOSIS — E103213 Type 1 diabetes mellitus with mild nonproliferative diabetic retinopathy with macular edema, bilateral: Secondary | ICD-10-CM | POA: Diagnosis not present

## 2024-08-18 DIAGNOSIS — E1065 Type 1 diabetes mellitus with hyperglycemia: Secondary | ICD-10-CM | POA: Diagnosis not present

## 2024-08-18 DIAGNOSIS — E669 Obesity, unspecified: Secondary | ICD-10-CM | POA: Diagnosis not present

## 2024-08-18 DIAGNOSIS — Z8639 Personal history of other endocrine, nutritional and metabolic disease: Secondary | ICD-10-CM | POA: Diagnosis not present

## 2024-08-21 ENCOUNTER — Other Ambulatory Visit: Payer: Self-pay

## 2024-08-21 ENCOUNTER — Other Ambulatory Visit (HOSPITAL_COMMUNITY): Payer: Self-pay

## 2024-08-21 MED ORDER — POTASSIUM CHLORIDE CRYS ER 20 MEQ PO TBCR
20.0000 meq | EXTENDED_RELEASE_TABLET | Freq: Two times a day (BID) | ORAL | 0 refills | Status: AC
  Filled 2024-08-21: qty 28, 14d supply, fill #0

## 2024-08-23 ENCOUNTER — Other Ambulatory Visit (HOSPITAL_COMMUNITY): Payer: Self-pay

## 2024-09-05 ENCOUNTER — Other Ambulatory Visit (HOSPITAL_COMMUNITY): Payer: Self-pay

## 2024-09-06 ENCOUNTER — Other Ambulatory Visit (HOSPITAL_COMMUNITY): Payer: Self-pay

## 2024-09-06 DIAGNOSIS — E876 Hypokalemia: Secondary | ICD-10-CM | POA: Diagnosis not present

## 2024-09-12 ENCOUNTER — Other Ambulatory Visit (HOSPITAL_COMMUNITY): Payer: Self-pay

## 2024-09-12 ENCOUNTER — Other Ambulatory Visit: Payer: Self-pay

## 2024-09-12 MED ORDER — LYUMJEV 100 UNIT/ML IJ SOLN
70.0000 [IU] | Freq: Every day | INTRAMUSCULAR | 4 refills | Status: AC
  Filled 2024-09-12: qty 60, 85d supply, fill #0
  Filled 2025-01-06: qty 60, 85d supply, fill #1

## 2024-09-12 MED ORDER — LYUMJEV 100 UNIT/ML IJ SOLN: 70.0000 [IU] | Freq: Every day | INTRAMUSCULAR | 2 refills | Status: AC

## 2024-09-13 ENCOUNTER — Other Ambulatory Visit: Payer: Self-pay

## 2024-09-13 ENCOUNTER — Other Ambulatory Visit (HOSPITAL_COMMUNITY): Payer: Self-pay

## 2024-09-15 ENCOUNTER — Other Ambulatory Visit (HOSPITAL_COMMUNITY): Payer: Self-pay

## 2024-09-15 ENCOUNTER — Other Ambulatory Visit: Payer: Self-pay

## 2024-09-15 DIAGNOSIS — E0590 Thyrotoxicosis, unspecified without thyrotoxic crisis or storm: Secondary | ICD-10-CM | POA: Diagnosis not present

## 2024-09-15 DIAGNOSIS — E1065 Type 1 diabetes mellitus with hyperglycemia: Secondary | ICD-10-CM | POA: Diagnosis not present

## 2024-09-15 MED ORDER — BAQSIMI TWO PACK 3 MG/DOSE NA POWD
NASAL | 4 refills | Status: AC
  Filled 2024-09-15: qty 2, 30d supply, fill #0

## 2024-10-15 ENCOUNTER — Other Ambulatory Visit (HOSPITAL_COMMUNITY): Payer: Self-pay

## 2024-10-16 ENCOUNTER — Other Ambulatory Visit: Payer: Self-pay

## 2024-10-18 DIAGNOSIS — E0590 Thyrotoxicosis, unspecified without thyrotoxic crisis or storm: Secondary | ICD-10-CM | POA: Diagnosis not present

## 2024-10-23 DIAGNOSIS — N950 Postmenopausal bleeding: Secondary | ICD-10-CM | POA: Diagnosis not present

## 2024-10-23 DIAGNOSIS — H52223 Regular astigmatism, bilateral: Secondary | ICD-10-CM | POA: Diagnosis not present

## 2024-10-23 DIAGNOSIS — H524 Presbyopia: Secondary | ICD-10-CM | POA: Diagnosis not present

## 2024-10-23 DIAGNOSIS — E109 Type 1 diabetes mellitus without complications: Secondary | ICD-10-CM | POA: Diagnosis not present

## 2024-10-24 ENCOUNTER — Other Ambulatory Visit: Payer: Self-pay

## 2024-10-24 ENCOUNTER — Other Ambulatory Visit (HOSPITAL_COMMUNITY): Payer: Self-pay

## 2024-10-24 MED ORDER — COMBIPATCH 0.05-0.25 MG/DAY TD PTTW
1.0000 | MEDICATED_PATCH | TRANSDERMAL | 3 refills | Status: AC
  Filled 2024-10-24: qty 24, 84d supply, fill #0
  Filled 2025-01-06: qty 24, 84d supply, fill #1

## 2024-11-09 ENCOUNTER — Other Ambulatory Visit (HOSPITAL_COMMUNITY): Payer: Self-pay

## 2024-12-11 ENCOUNTER — Other Ambulatory Visit (HOSPITAL_COMMUNITY): Payer: Self-pay

## 2024-12-29 ENCOUNTER — Other Ambulatory Visit (HOSPITAL_COMMUNITY): Payer: Self-pay

## 2024-12-29 ENCOUNTER — Other Ambulatory Visit: Payer: Self-pay

## 2025-01-06 ENCOUNTER — Other Ambulatory Visit (HOSPITAL_COMMUNITY): Payer: Self-pay

## 2025-01-08 ENCOUNTER — Other Ambulatory Visit: Payer: Self-pay

## 2025-01-09 ENCOUNTER — Other Ambulatory Visit: Payer: Self-pay

## 2025-01-27 ENCOUNTER — Other Ambulatory Visit (HOSPITAL_COMMUNITY): Payer: Self-pay

## 2025-02-01 ENCOUNTER — Other Ambulatory Visit: Payer: Self-pay

## 2025-05-11 ENCOUNTER — Encounter: Admitting: Family Medicine
